# Patient Record
Sex: Female | Born: 1960 | Race: White | Hispanic: No | Smoking: Former smoker
Health system: Southern US, Community
[De-identification: ages and names within clinical notes are randomized; demographics above are authoritative.]

## PROBLEM LIST (undated history)

## (undated) DIAGNOSIS — I10 Essential (primary) hypertension: Secondary | ICD-10-CM

## (undated) DIAGNOSIS — F419 Anxiety disorder, unspecified: Secondary | ICD-10-CM

## (undated) DIAGNOSIS — M1990 Unspecified osteoarthritis, unspecified site: Secondary | ICD-10-CM

## (undated) DIAGNOSIS — T7840XA Allergy, unspecified, initial encounter: Secondary | ICD-10-CM

## (undated) DIAGNOSIS — G43909 Migraine, unspecified, not intractable, without status migrainosus: Secondary | ICD-10-CM

## (undated) HISTORY — PX: NO PAST SURGERIES: SHX2092

## (undated) HISTORY — DX: Allergy, unspecified, initial encounter: T78.40XA

## (undated) HISTORY — DX: Unspecified osteoarthritis, unspecified site: M19.90

## (undated) HISTORY — DX: Anxiety disorder, unspecified: F41.9

## (undated) HISTORY — DX: Migraine, unspecified, not intractable, without status migrainosus: G43.909

## (undated) HISTORY — DX: Essential (primary) hypertension: I10

---

## 1998-07-18 ENCOUNTER — Other Ambulatory Visit: Admission: RE | Admit: 1998-07-18 | Discharge: 1998-07-18 | Payer: Self-pay | Admitting: Obstetrics and Gynecology

## 1998-09-16 ENCOUNTER — Ambulatory Visit (HOSPITAL_COMMUNITY): Admission: RE | Admit: 1998-09-16 | Discharge: 1998-09-16 | Payer: Self-pay | Admitting: Obstetrics and Gynecology

## 2002-03-15 ENCOUNTER — Other Ambulatory Visit: Admission: RE | Admit: 2002-03-15 | Discharge: 2002-03-15 | Payer: Self-pay | Admitting: Obstetrics and Gynecology

## 2002-03-24 ENCOUNTER — Ambulatory Visit (HOSPITAL_COMMUNITY): Admission: RE | Admit: 2002-03-24 | Discharge: 2002-03-24 | Payer: Self-pay | Admitting: Obstetrics and Gynecology

## 2002-03-24 ENCOUNTER — Encounter: Payer: Self-pay | Admitting: Obstetrics and Gynecology

## 2006-05-18 ENCOUNTER — Other Ambulatory Visit: Admission: RE | Admit: 2006-05-18 | Discharge: 2006-05-18 | Payer: Self-pay | Admitting: Obstetrics and Gynecology

## 2006-05-24 ENCOUNTER — Encounter: Admission: RE | Admit: 2006-05-24 | Discharge: 2006-05-24 | Payer: Self-pay | Admitting: Obstetrics and Gynecology

## 2010-04-01 ENCOUNTER — Encounter (INDEPENDENT_AMBULATORY_CARE_PROVIDER_SITE_OTHER): Payer: Self-pay | Admitting: *Deleted

## 2010-05-07 ENCOUNTER — Encounter (INDEPENDENT_AMBULATORY_CARE_PROVIDER_SITE_OTHER): Payer: Self-pay | Admitting: *Deleted

## 2010-06-10 ENCOUNTER — Ambulatory Visit (HOSPITAL_COMMUNITY): Admission: RE | Admit: 2010-06-10 | Discharge: 2010-06-10 | Payer: Self-pay | Admitting: Obstetrics

## 2010-09-14 ENCOUNTER — Encounter: Payer: Self-pay | Admitting: Obstetrics and Gynecology

## 2010-09-23 NOTE — Letter (Signed)
Summary: Pre Visit No Show Letter  [facility]  [address] [place], [place] [address]   Phone: [phone]  Fax: [phone]        May 07, 2010 MRN: [id number]    Brooke Harrison [address] [place], [place]  [address]    Dear [name],   We have been unable to reach you by phone concerning the pre-procedure visit that you missed on 05/07/10. For this reason,your procedure scheduled on 06/02/10 has been cancelled. Our scheduling staff will gladly assist you with rescheduling your appointments at a more convenient time. Please call our office at [phone] between the hours of 8:00am and 5:00pm, press option #2 to reach an appointment scheduler. Please consider updating your contact numbers at this time so that we can reach you by phone in the future with schedule changes or results.    Thank you,    Wyona Almas RN [facility]

## 2010-09-23 NOTE — Letter (Signed)
Summary: Previsit letter  [facility]  [address] [place], [place] [address]   Phone: [phone]  Fax: [phone]       04/01/2010 MRN: [id number]  [facility] [address] [place], [place]  [address]  Dear Brooke Harrison,  Welcome to the Gastroenterology Division at [facility].    You are scheduled to see a nurse for your pre-procedure visit on 05-07-10 at 4:00p.m. on the 3rd floor at [facility], [address]  We ask that you try to arrive at our office 15 minutes prior to your appointment time to allow for check-in.  Your nurse visit will consist of discussing your medical and surgical history, your immediate family medical history, and your medications.    Please bring a complete list of all your medications or, if you prefer, bring the medication bottles and we will list them.  We will need to be aware of both prescribed and over the counter drugs.  We will need to know exact dosage information as well.  If you are on blood thinners (Coumadin, Plavix, Aggrenox, Ticlid, etc.) please call our office today/prior to your appointment, as we need to consult with your physician about holding your medication.   Please be prepared to read and sign documents such as consent forms, a financial agreement, and acknowledgement forms.  If necessary, and with your consent, a friend or relative is welcome to sit-in on the nurse visit with you.  Please bring your insurance card so that we may make a copy of it.  If your insurance requires a referral to see a specialist, please bring your referral form from your primary care physician.  No co-pay is required for this nurse visit.     If you cannot keep your appointment, please call [phone] to cancel or reschedule prior to your appointment date.  This allows [place] the opportunity to schedule an appointment for another patient in need of care.    Thank you for choosing [facility] for your medical  needs.  We appreciate the opportunity to care for you.  Please visit [place] at our website  to learn more about our practice.                     Sincerely.                                                                                                                   The Gastroenterology Division

## 2013-04-28 ENCOUNTER — Ambulatory Visit: Payer: BC Managed Care – PPO

## 2013-04-28 ENCOUNTER — Ambulatory Visit (INDEPENDENT_AMBULATORY_CARE_PROVIDER_SITE_OTHER): Payer: BC Managed Care – PPO | Admitting: Emergency Medicine

## 2013-04-28 VITALS — BP 110/74 | HR 79 | Temp 98.5°F | Resp 18 | Ht 69.0 in | Wt 192.0 lb

## 2013-04-28 DIAGNOSIS — R0600 Dyspnea, unspecified: Secondary | ICD-10-CM

## 2013-04-28 DIAGNOSIS — R05 Cough: Secondary | ICD-10-CM

## 2013-04-28 DIAGNOSIS — R0609 Other forms of dyspnea: Secondary | ICD-10-CM

## 2013-04-28 DIAGNOSIS — I10 Essential (primary) hypertension: Secondary | ICD-10-CM | POA: Insufficient documentation

## 2013-04-28 DIAGNOSIS — R5381 Other malaise: Secondary | ICD-10-CM

## 2013-04-28 LAB — POCT CBC
Granulocyte percent: 69.4 %G (ref 37–80)
HCT, POC: 41.4 % (ref 37.7–47.9)
Hemoglobin: 13.1 g/dL (ref 12.2–16.2)
Lymph, poc: 1.5 (ref 0.6–3.4)
MCH, POC: 28.6 pg (ref 27–31.2)
MCV: 90.3 fL (ref 80–97)
MPV: 10.1 fL (ref 0–99.8)
POC LYMPH PERCENT: 25.2 %L (ref 10–50)
POC MID %: 5.4 %M (ref 0–12)
Platelet Count, POC: 216 10*3/uL (ref 142–424)
RBC: 4.58 M/uL (ref 4.04–5.48)
WBC: 5.8 10*3/uL (ref 4.6–10.2)

## 2013-04-28 MED ORDER — BENZONATATE 100 MG PO CAPS: 100.0000 mg | ORAL_CAPSULE | Freq: Three times a day (TID) | ORAL | Status: DC | PRN

## 2013-04-28 NOTE — Progress Notes (Signed)
  Subjective:    Patient ID: Brooke Harrison, female    DOB: 1961-08-22, 52 y.o.   MRN: [id number]  HPI Pt reports no energy, dyspnea. Started last Saturday with a cold, still has some nasal drip, cough. But fatigue has remained constant. No one sick at home. Non smoker. She also reports Sore throat, dizziness. She took some robitussin, benadryl last night.    Review of Systems     Objective:   Physical Exam patient is alert and cooperative she is not in any distress. Her neck is supple. Chest exam reveals a few faint rhonchi in the right base there is no dullness. Cardiac is regular rate no murmurs. Abdomen soft nontender.  [facility] reading (PRIMARY) by  Dr. Cleta Alberts appears to be some mild increased interstitial markings in the bases but otherwise unremarkable. There are no consolidated areas and heart size is normal.  Results for orders placed in visit on 04/28/13  POCT CBC      Result Value Range   WBC 5.8  4.6 - 10.2 K/uL   Lymph, poc 1.5  0.6 - 3.4   POC LYMPH PERCENT 25.2  10 - 50 %L   MID (cbc) 0.3  0 - 0.9   POC MID % 5.4  0 - 12 %M   POC Granulocyte 4.0  2 - 6.9   Granulocyte percent 69.4  37 - 80 %G   RBC 4.58  4.04 - 5.48 M/uL   Hemoglobin 13.1  12.2 - 16.2 g/dL   HCT, POC 11.9  14.7 - 47.9 %   MCV 90.3  80 - 97 fL   MCH, POC 28.6  27 - 31.2 pg   MCHC 31.6 (*) 31.8 - 35.4 g/dL   RDW, POC 82.9     Platelet Count, POC 216  142 - 424 K/uL   MPV 10.1  0 - 99.8 fL        Assessment & Plan:  We'll treat with Tessalon Perles she was given a note for work [employer]

## 2013-04-28 NOTE — Progress Notes (Signed)
  Subjective:    Patient ID: Brooke Harrison, female    DOB: 03/05/1961, 52 y.o.   MRN: [id number]  HPI    Review of Systems     Objective:   Physical Exam        Assessment & Plan:

## 2013-04-29 LAB — COMPREHENSIVE METABOLIC PANEL
Albumin: 3.4 g/dL — ABNORMAL LOW (ref 3.5–5.2)
Alkaline Phosphatase: 55 U/L (ref 39–117)
BUN: 12 mg/dL (ref 6–23)
Calcium: 8.6 mg/dL (ref 8.4–10.5)
Chloride: 107 mEq/L (ref 96–112)
Glucose, Bld: 105 mg/dL — ABNORMAL HIGH (ref 70–99)
Potassium: 4.2 mEq/L (ref 3.5–5.3)
Sodium: 138 mEq/L (ref 135–145)
Total Protein: 5.2 g/dL — ABNORMAL LOW (ref 6.0–8.3)

## 2013-05-01 ENCOUNTER — Other Ambulatory Visit (HOSPITAL_COMMUNITY): Payer: Self-pay | Admitting: Internal Medicine

## 2013-05-01 ENCOUNTER — Telehealth: Payer: Self-pay

## 2013-05-01 DIAGNOSIS — Z1231 Encounter for screening mammogram for malignant neoplasm of breast: Secondary | ICD-10-CM

## 2013-05-01 NOTE — Telephone Encounter (Signed)
PT HAD LABS DONE AND WAS GIVEN A CALL THE OTHER DAY. PLEASE CALL [phone]

## 2013-05-01 NOTE — Telephone Encounter (Signed)
Daub, MD on 04/30/2013 at 10:59 AM Her sugar is slightly elevated and her albumin is slightly low. It would be a good idea at some time in the near future to schedule for a complete physical just to make sure her examination is normal. Called her to advise. She indicates she did have a physical couple months ago with Dr Nicholos Johns. Results sent to his office for her. She also notes she had peanut butter crackers and juice just prior to the lab draw.

## 2013-05-08 ENCOUNTER — Ambulatory Visit (HOSPITAL_COMMUNITY): Payer: Self-pay | Attending: Internal Medicine

## 2013-08-23 ENCOUNTER — Other Ambulatory Visit: Payer: Self-pay | Admitting: Emergency Medicine

## 2013-09-04 ENCOUNTER — Encounter: Payer: Self-pay | Admitting: Gastroenterology

## 2013-10-27 ENCOUNTER — Ambulatory Visit (AMBULATORY_SURGERY_CENTER): Payer: BC Managed Care – PPO

## 2013-10-27 VITALS — Ht 69.0 in | Wt 190.0 lb

## 2013-10-27 DIAGNOSIS — Z1211 Encounter for screening for malignant neoplasm of colon: Secondary | ICD-10-CM

## 2013-10-27 MED ORDER — MOVIPREP 100 G PO SOLR: 1.0000 | Freq: Once | ORAL | Status: DC

## 2013-11-10 ENCOUNTER — Encounter: Payer: Self-pay | Admitting: Gastroenterology

## 2014-03-16 ENCOUNTER — Encounter (HOSPITAL_COMMUNITY): Payer: Self-pay | Admitting: Emergency Medicine

## 2014-03-16 ENCOUNTER — Emergency Department (HOSPITAL_COMMUNITY)
Admission: EM | Admit: 2014-03-16 | Discharge: 2014-03-16 | Disposition: A | Discharge status: Home / Self Care | Payer: Worker's Compensation | Attending: Emergency Medicine | Admitting: Emergency Medicine

## 2014-03-16 DIAGNOSIS — F411 Generalized anxiety disorder: Secondary | ICD-10-CM | POA: Insufficient documentation

## 2014-03-16 DIAGNOSIS — Z79899 Other long term (current) drug therapy: Secondary | ICD-10-CM | POA: Diagnosis not present

## 2014-03-16 DIAGNOSIS — R112 Nausea with vomiting, unspecified: Secondary | ICD-10-CM | POA: Diagnosis not present

## 2014-03-16 DIAGNOSIS — R197 Diarrhea, unspecified: Secondary | ICD-10-CM | POA: Insufficient documentation

## 2014-03-16 DIAGNOSIS — I10 Essential (primary) hypertension: Secondary | ICD-10-CM | POA: Insufficient documentation

## 2014-03-16 DIAGNOSIS — Z8739 Personal history of other diseases of the musculoskeletal system and connective tissue: Secondary | ICD-10-CM | POA: Diagnosis not present

## 2014-03-16 DIAGNOSIS — R109 Unspecified abdominal pain: Secondary | ICD-10-CM | POA: Insufficient documentation

## 2014-03-16 DIAGNOSIS — Z87891 Personal history of nicotine dependence: Secondary | ICD-10-CM | POA: Diagnosis not present

## 2014-03-16 LAB — CBC
HCT: 36.8 % (ref 36.0–46.0)
Hemoglobin: 12.4 g/dL (ref 12.0–15.0)
MCH: 28.4 pg (ref 26.0–34.0)
MCHC: 33.7 g/dL (ref 30.0–36.0)
MCV: 84.2 fL (ref 78.0–100.0)
PLATELETS: 201 10*3/uL (ref 150–400)
RBC: 4.37 MIL/uL (ref 3.87–5.11)
RDW: 12.4 % (ref 11.5–15.5)
WBC: 6.5 10*3/uL (ref 4.0–10.5)

## 2014-03-16 LAB — I-STAT CHEM 8, ED
BUN: 20 mg/dL (ref 6–23)
CREATININE: 0.9 mg/dL (ref 0.50–1.10)
Calcium, Ion: 1.2 mmol/L (ref 1.12–1.23)
Chloride: 105 mEq/L (ref 96–112)
GLUCOSE: 102 mg/dL — AB (ref 70–99)
HCT: 38 % (ref 36.0–46.0)
Hemoglobin: 12.9 g/dL (ref 12.0–15.0)
Potassium: 3.6 mEq/L — ABNORMAL LOW (ref 3.7–5.3)
Sodium: 140 mEq/L (ref 137–147)
TCO2: 22 mmol/L (ref 0–100)

## 2014-03-16 MED ORDER — SODIUM CHLORIDE 0.9 % IV BOLUS (SEPSIS)
1000.0000 mL | Freq: Once | INTRAVENOUS | Status: AC
  Administered 2014-03-16: 1000 mL via INTRAVENOUS

## 2014-03-16 MED ORDER — KETOROLAC TROMETHAMINE 30 MG/ML IJ SOLN
30.0000 mg | Freq: Once | INTRAMUSCULAR | Status: AC
  Administered 2014-03-16: 30 mg via INTRAVENOUS
  Filled 2014-03-16: qty 1

## 2014-03-16 MED ORDER — GI COCKTAIL ~~LOC~~
30.0000 mL | Freq: Once | ORAL | Status: AC
  Administered 2014-03-16: 30 mL via ORAL
  Filled 2014-03-16: qty 30

## 2014-03-16 MED ORDER — POTASSIUM CHLORIDE CRYS ER 20 MEQ PO TBCR
20.0000 meq | EXTENDED_RELEASE_TABLET | Freq: Once | ORAL | Status: AC
  Administered 2014-03-16: 20 meq via ORAL
  Filled 2014-03-16: qty 1

## 2014-03-16 MED ORDER — ONDANSETRON HCL 4 MG/2ML IJ SOLN
4.0000 mg | Freq: Once | INTRAMUSCULAR | Status: AC
  Administered 2014-03-16: 4 mg via INTRAVENOUS
  Filled 2014-03-16: qty 2

## 2014-03-16 NOTE — ED Notes (Signed)
Per pt report: pt c/o burning sensation in her esophagus area, nauseas in her abd, and cramps in her lower abd.  Pt reports 1 episode of vomiting.  Pt also endorses diarrhea.

## 2014-03-16 NOTE — ED Provider Notes (Signed)
Medical screening examination/treatment/procedure(s) were performed by non-physician practitioner and as supervising physician I was immediately available for consultation/collaboration.   EKG Interpretation None        Julianne Rice, MD 03/16/14 [phone]

## 2014-03-16 NOTE — Discharge Instructions (Signed)
Today your lab values were within normal limits, you were given IV fluid, and medication for symptom control of the nausea, vomiting, and diarrhea.  Please try to eat lightly for the next hours, you should not need any further treatment

## 2014-03-16 NOTE — ED Provider Notes (Signed)
CSN: [id number]     Arrival date & time 03/16/14  0116 History   First MD Initiated Contact with Patient 03/16/14 [phone]     Chief Complaint  Patient presents with  . Abdominal Pain     (Consider location/radiation/quality/duration/timing/severity/associated sxs/prior Treatment) HPI Comments: Patient states she works for [employer], approximately one week ago.  A carton of amoxicillin still open.  It was cauterized gets several of these burst open and patient states she was "cough or with the powder."  She thinks she may have breathed some of this in 2-3, days later, she noted she was having some abdominal cramping, and one episode of vomiting, and one loose stool.  She denies any shortness of breath, coughing.  Patient is a 53 y.o. female presenting with abdominal pain. The history is provided by the patient.  Abdominal Pain Pain location:  Generalized Pain quality: aching and burning   Pain severity:  Mild Onset quality:  Gradual Duration:  1 week Timing:  Intermittent Progression:  Worsening Chronicity:  New Relieved by:  Nothing Worsened by:  Nothing tried Ineffective treatments:  None tried Associated symptoms: diarrhea, nausea and vomiting   Associated symptoms: no anorexia, no chills, no cough, no dysuria, no fever and no shortness of breath   Diarrhea:    Quality:  Semi-solid   Number of occurrences:  1   Severity:  Mild Nausea:    Severity:  Mild   Onset quality:  Gradual   Duration:  3 days   Timing:  Intermittent Vomiting:    Quality:  Bilious material   Number of occurrences:  1   Severity:  Mild Risk factors comment:  Exposure to powder, amoxicillin   Past Medical History  Diagnosis Date  . Allergy   . Arthritis   . Anxiety   . Hypertension    History reviewed. No pertinent past surgical history. Family History  Problem Relation Age of Onset  . Colon cancer Neg Hx    History  Substance Use Topics  . Smoking status: Former Research scientist (life sciences)  . Smokeless tobacco:  Never Used  . Alcohol Use: Yes     Comment: 1 every 5 months   OB History   Grav Para Term Preterm Abortions TAB SAB Ect Mult Living                 Review of Systems  Constitutional: Negative for fever and chills.  Respiratory: Negative for cough and shortness of breath.   Gastrointestinal: Positive for nausea, vomiting, abdominal pain and diarrhea. Negative for anorexia.  Genitourinary: Negative for dysuria.  Skin: Negative for wound.  Psychiatric/Behavioral: The patient is nervous/anxious.   All other systems reviewed and are negative.     Allergies  Review of patient's allergies indicates no known allergies.  Home Medications   Prior to Admission medications   Medication Sig Start Date End Date Taking? Authorizing Provider  amLODipine (NORVASC) 5 MG tablet Take 5 mg by mouth daily.   Yes Historical Provider, MD  FLUoxetine (PROZAC) 20 MG tablet Take 20 mg by mouth daily.   Yes Historical Provider, MD   BP 137/86  Pulse 77  Temp(Src) 97.9 F (36.6 C) (Oral)  Resp 18  SpO2 100% Physical Exam  Nursing note and vitals reviewed. Constitutional: She is oriented to person, place, and time. She appears well-developed and well-nourished. No distress.  HENT:  Mouth/Throat: Oropharynx is clear and moist.  Eyes: Pupils are equal, round, and reactive to light.  Neck: Normal range of motion.  Cardiovascular: Normal rate and regular rhythm.   Pulmonary/Chest: Effort normal and breath sounds normal.  Abdominal: Soft. Bowel sounds are normal. She exhibits no distension. There is no tenderness.  Musculoskeletal: Normal range of motion.  Neurological: She is alert and oriented to person, place, and time.  Skin: Skin is warm and dry.    ED Course  Procedures (including critical care time) Labs Review Labs Reviewed  I-STAT CHEM 8, ED - Abnormal; Notable for the following:    Potassium 3.6 (*)    Glucose, Bld 102 (*)    All other components within normal limits  CBC     Imaging Review No results found.   EKG Interpretation None      MDM  Patient's CBC i-STAT within normal limits should be given IV fluid, Zofran.  Her dressing was noted to be 3.6.  This was supplemented with 20 mEq of potassium by mouth on reassessment patient states, that she is feeling significantly better, although her abdominal cramping, is slightly returning.  She will be given, 30 mg of IV to oral and discharge home Final diagnoses:  Nausea vomiting and diarrhea         Garald Balding, NP 03/16/14 [phone]

## 2016-06-26 DIAGNOSIS — I10 Essential (primary) hypertension: Secondary | ICD-10-CM | POA: Diagnosis not present

## 2016-06-26 DIAGNOSIS — R51 Headache: Secondary | ICD-10-CM | POA: Diagnosis not present

## 2016-10-08 ENCOUNTER — Emergency Department (HOSPITAL_COMMUNITY)
Admission: EM | Admit: 2016-10-08 | Discharge: 2016-10-08 | Disposition: A | Discharge status: Home / Self Care | Payer: BLUE CROSS/BLUE SHIELD | Attending: Emergency Medicine | Admitting: Emergency Medicine

## 2016-10-08 ENCOUNTER — Emergency Department (HOSPITAL_COMMUNITY): Payer: BLUE CROSS/BLUE SHIELD

## 2016-10-08 ENCOUNTER — Encounter (HOSPITAL_COMMUNITY): Payer: Self-pay | Admitting: Emergency Medicine

## 2016-10-08 DIAGNOSIS — R55 Syncope and collapse: Secondary | ICD-10-CM | POA: Diagnosis not present

## 2016-10-08 DIAGNOSIS — I10 Essential (primary) hypertension: Secondary | ICD-10-CM | POA: Insufficient documentation

## 2016-10-08 DIAGNOSIS — R05 Cough: Secondary | ICD-10-CM | POA: Diagnosis not present

## 2016-10-08 DIAGNOSIS — Z79899 Other long term (current) drug therapy: Secondary | ICD-10-CM | POA: Diagnosis not present

## 2016-10-08 DIAGNOSIS — R42 Dizziness and giddiness: Secondary | ICD-10-CM

## 2016-10-08 DIAGNOSIS — Z87891 Personal history of nicotine dependence: Secondary | ICD-10-CM | POA: Diagnosis not present

## 2016-10-08 DIAGNOSIS — Z7982 Long term (current) use of aspirin: Secondary | ICD-10-CM | POA: Insufficient documentation

## 2016-10-08 LAB — BASIC METABOLIC PANEL
ANION GAP: 8 (ref 5–15)
BUN: 17 mg/dL (ref 6–20)
CO2: 24 mmol/L (ref 22–32)
Calcium: 9.3 mg/dL (ref 8.9–10.3)
Chloride: 108 mmol/L (ref 101–111)
Creatinine, Ser: 0.76 mg/dL (ref 0.44–1.00)
GFR calc Af Amer: >60 mL/min (ref >60)
Glucose, Bld: 112 mg/dL — ABNORMAL HIGH (ref 65–99)
Potassium: 3.9 mmol/L (ref 3.5–5.1)
SODIUM: 140 mmol/L (ref 135–145)

## 2016-10-08 LAB — CBC WITH DIFFERENTIAL/PLATELET
BASOS ABS: 0 10*3/uL (ref 0.0–0.1)
Basophils Relative: 0 %
EOS PCT: 1 %
Eosinophils Absolute: 0.1 10*3/uL (ref 0.0–0.7)
HEMATOCRIT: 38.7 % (ref 36.0–46.0)
Hemoglobin: 13.3 g/dL (ref 12.0–15.0)
Lymphocytes Relative: 12 %
Lymphs Abs: 1.1 10*3/uL (ref 0.7–4.0)
MCH: 28.5 pg (ref 26.0–34.0)
MCHC: 34.4 g/dL (ref 30.0–36.0)
MCV: 83 fL (ref 78.0–100.0)
Monocytes Absolute: 0.5 10*3/uL (ref 0.1–1.0)
Monocytes Relative: 5 %
NEUTROS ABS: 8.2 10*3/uL — AB (ref 1.7–7.7)
NEUTROS PCT: 82 %
PLATELETS: 243 10*3/uL (ref 150–400)
RBC: 4.66 MIL/uL (ref 3.87–5.11)
RDW: 12.5 % (ref 11.5–15.5)
WBC: 9.9 10*3/uL (ref 4.0–10.5)

## 2016-10-08 LAB — URINALYSIS, ROUTINE W REFLEX MICROSCOPIC
Bilirubin Urine: NEGATIVE
Glucose, UA: NEGATIVE mg/dL
KETONES UR: NEGATIVE mg/dL
NITRITE: NEGATIVE
Protein, ur: NEGATIVE mg/dL
Specific Gravity, Urine: 1.01 (ref 1.005–1.030)
pH: 7 (ref 5.0–8.0)

## 2016-10-08 LAB — I-STAT TROPONIN, ED
Troponin i, poc: 0.01 ng/mL (ref 0.00–0.08)
Troponin i, poc: 0 ng/mL (ref 0.00–0.08)

## 2016-10-08 MED ORDER — MECLIZINE HCL 25 MG PO TABS
25.0000 mg | ORAL_TABLET | Freq: Once | ORAL | Status: AC
  Administered 2016-10-08: 25 mg via ORAL
  Filled 2016-10-08: qty 1

## 2016-10-08 MED ORDER — GADOBENATE DIMEGLUMINE 529 MG/ML IV SOLN
20.0000 mL | Freq: Once | INTRAVENOUS | Status: AC | PRN
  Administered 2016-10-08: 17 mL via INTRAVENOUS

## 2016-10-08 MED ORDER — MECLIZINE HCL 25 MG PO TABS
25.0000 mg | ORAL_TABLET | Freq: Three times a day (TID) | ORAL | 0 refills | Status: DC | PRN
Start: 2016-10-08 — End: 2016-12-16

## 2016-10-08 MED ORDER — LORAZEPAM 2 MG/ML IJ SOLN
1.0000 mg | Freq: Once | INTRAMUSCULAR | Status: AC | PRN
Start: 2016-10-08 — End: 2016-10-08
  Administered 2016-10-08: 1 mg via INTRAVENOUS
  Filled 2016-10-08: qty 1

## 2016-10-08 MED ORDER — ONDANSETRON HCL 4 MG/2ML IJ SOLN
4.0000 mg | Freq: Once | INTRAMUSCULAR | Status: AC
  Administered 2016-10-08: 4 mg via INTRAVENOUS
  Filled 2016-10-08: qty 2

## 2016-10-08 MED ORDER — ONDANSETRON HCL 4 MG PO TABS
4.0000 mg | ORAL_TABLET | Freq: Four times a day (QID) | ORAL | 0 refills | Status: DC
Start: 2016-10-08 — End: 2016-12-16

## 2016-10-08 NOTE — ED Notes (Signed)
Patient being transported to MRI

## 2016-10-08 NOTE — ED Provider Notes (Signed)
[facility] DEPT Provider Note   CSN: ID:[id number] Arrival date & time: 10/08/16  [id number]     History   Chief Complaint Chief Complaint  Patient presents with  . Loss of Consciousness    HPI Brooke Harrison is a 56 y.o. female.  The history is provided by the patient. No language interpreter was used.  Loss of Consciousness      Brooke Harrison is a 56 y.o. female who presents to the Emergency Department complaining of syncope.  She presents following an episode of syncope today. Over the last 2 weeks she has had a swishing sensation in bilateral ears, left greater than right. Today she was feeling well when she was walking in her bedroom when she had a syncopal event. She briefly passed out and had no preceding symptoms. When she awoke she was on the floor with mild tingling in bilateral hands as well as a dizzy sensation where she felt vertiginous and had one episode of emesis with diaphoresis. She denies any chest pain, shortness of breath, weakness. Her dizziness was persistent following the syncopal event but now is intermittent and worse with movement. Reactive hypertension but no other medical problems. No prior similar symptoms.  Past Medical History:  Diagnosis Date  . Allergy   . Anxiety   . Arthritis   . Hypertension     Patient Active Problem List   Diagnosis Date Noted  . Unspecified essential hypertension 04/28/2013    History reviewed. No pertinent surgical history.  OB History    No data available       Home Medications    Prior to Admission medications   Medication Sig Start Date End Date Taking? Authorizing Provider  amLODipine (NORVASC) 5 MG tablet Take 5 mg by mouth daily.   Yes Historical Provider, MD  Aspirin-Acetaminophen-Caffeine (GOODY HEADACHE PO) Take 1 packet by mouth every 6 (six) hours as needed (for headaches).   Yes Historical Provider, MD  diphenhydrAMINE (BENADRYL) 25 MG tablet Take 25 mg by mouth every 6 (six) hours as needed (for  headaches).   Yes Historical Provider, MD  FLUoxetine (PROZAC) 40 MG capsule Take 40 mg by mouth daily.   Yes Historical Provider, MD  meclizine (ANTIVERT) 25 MG tablet Take 1 tablet (25 mg total) by mouth 3 (three) times daily as needed for dizziness. 10/08/16   Quintella Reichert, MD  ondansetron (ZOFRAN) 4 MG tablet Take 1 tablet (4 mg total) by mouth every 6 (six) hours. 10/08/16   Quintella Reichert, MD    Family History Family History  Problem Relation Age of Onset  . Colon cancer Neg Hx     Social History Social History  Substance Use Topics  . Smoking status: Former Research scientist (life sciences)  . Smokeless tobacco: Never Used  . Alcohol use Yes     Comment: 1 every 5 months     Allergies   Patient has no known allergies.   Review of Systems Review of Systems  Cardiovascular: Positive for syncope.  All other systems reviewed and are negative.    Physical Exam Updated Vital Signs BP 139/84   Pulse 70   Temp 97.9 F (36.6 C) (Oral)   Resp 18   Wt 195 lb (88.5 kg)   SpO2 99%   BMI 28.80 kg/m   Physical Exam  Constitutional: She is oriented to person, place, and time. She appears well-developed and well-nourished.  HENT:  Head: Normocephalic and atraumatic.  Right Ear: External ear normal.  Left Ear: External ear  normal.  Eyes: EOM are normal. Pupils are equal, round, and reactive to light.  Neck:  No carotid bruit  Cardiovascular: Normal rate and regular rhythm.   No murmur heard. Pulmonary/Chest: Effort normal and breath sounds normal. No respiratory distress.  Abdominal: Soft. There is no tenderness. There is no rebound and no guarding.  Musculoskeletal: She exhibits no edema or tenderness.  Neurological: She is alert and oriented to person, place, and time. No cranial nerve deficit.  5 out of 5 strength in all 4 extremities. Sensation to light touch intact in all 4 extremities. No pronator drift. No ataxia or FTN bilaterally.  Skin: Skin is warm and dry.  Psychiatric: She has a  normal mood and affect. Her behavior is normal.  Nursing note and vitals reviewed.    ED Treatments / Results  Labs (all labs ordered are listed, but only abnormal results are displayed) Labs Reviewed  BASIC METABOLIC PANEL - Abnormal; Notable for the following:       Result Value   Glucose, Bld 112 (*)    All other components within normal limits  CBC WITH DIFFERENTIAL/PLATELET - Abnormal; Notable for the following:    Neutro Abs 8.2 (*)    All other components within normal limits  URINALYSIS, ROUTINE W REFLEX MICROSCOPIC - Abnormal; Notable for the following:    APPearance CLOUDY (*)    Hgb urine dipstick SMALL (*)    Leukocytes, UA SMALL (*)    Bacteria, UA RARE (*)    Squamous Epithelial / LPF 0-5 (*)    All other components within normal limits  I-STAT TROPOININ, ED  I-STAT TROPOININ, ED    EKG  EKG Interpretation  Date/Time:  Thursday October 08 2016 07:35:25 EST Ventricular Rate:  59 PR Interval:    QRS Duration: 122 QT Interval:  468 QTC Calculation: 464 R Axis:   67 Text Interpretation:  Sinus rhythm Nonspecific intraventricular conduction delay Nonspecific T abnrm, anterolateral leads Confirmed by Hazle Coca [phone]) on 10/08/2016 8:00:46 AM       Radiology Dg Chest 2 View  Result Date: 10/08/2016 CLINICAL DATA:  Syncope this morning, cough, dizziness EXAM: CHEST  2 VIEW COMPARISON:  04/28/2013 FINDINGS: Cardiomediastinal silhouette is unremarkable. No infiltrate or pleural effusion. No pulmonary edema. Mild degenerative changes mid thoracic spine. IMPRESSION: No active cardiopulmonary disease. Electronically Signed   By: Lahoma Crocker M.D.   On: 10/08/2016 08:58   Ct Head Wo Contrast  Result Date: 10/08/2016 CLINICAL DATA:  Patient with dizziness. Syncopal episode. Vomiting. EXAM: CT HEAD WITHOUT CONTRAST TECHNIQUE: Contiguous axial images were obtained from the base of the skull through the vertex without intravenous contrast. COMPARISON:  None. FINDINGS:  Brain: No acute cortically based infarct, intracranial hemorrhage or significant mass effect. Posteriorly at the level of the tentorium there is a 2.7 cm predominately fluid attenuation mass with internal soft tissue density (image 15; series 2). There is underlying scalloping of the inner table of the skull. Vascular: No hyperdense vessel or unexpected calcification. Skull: Scalloping of the inner table of the skull posteriorly. Sinuses/Orbits: No acute finding. Other: None. IMPRESSION: There is a predominately fluid attenuation mass lesion posteriorly at the level of the tentorium with scalloping of the adjacent skull. There is suggestion of internal soft tissue density. This is nonspecific in etiology however potentially represents a large arachnoid granulation. In the nonacute setting, recommend correlation with contrast-enhanced brain MRI. No acute intracranial process. Electronically Signed   By: Lovey Newcomer M.D.   On: 10/08/2016  09:26   Mr Jeri Cos And Wo Contrast  Result Date: 10/08/2016 CLINICAL DATA:  56 year old female who awoke with dizziness this morning and then had a syncopal episode. Vomiting. Initial encounter. EXAM: MRI HEAD WITHOUT AND WITH CONTRAST TECHNIQUE: Multiplanar, multiecho pulse sequences of the brain and surrounding structures were obtained without and with intravenous contrast. CONTRAST:  67mL MULTIHANCE GADOBENATE DIMEGLUMINE 529 MG/ML IV SOLN COMPARISON:  Head CT without contrast 0855 hours today. FINDINGS: Brain: No restricted diffusion to suggest acute infarction. No midline shift, evidence of mass lesion, ventriculomegaly, extra-axial collection or acute intracranial hemorrhage. Cervicomedullary junction within normal limits. However, along the occipital protuberance just to the left of midline where the noncontrast CT earlier today showed an incomplete bony defect in the occipital bone there is focal herniation of the posterior left cerebellar hemisphere which appears  elongated (series 6, image 8 and series 4, image 13. There is no enhancement of the bony defect or surrounding soft tissues with otherwise normal CSF signal in the region, most resembling an arachnoid granulation measuring nearly 2 cm. It is unclear whether there may be some abnormal signal in the herniated cerebellar folia (series 7, image 7), with no overt cerebellar encephalomalacia. There is superimposed partially empty sella. Elsewhere normal cerebral volume and normal gray and white matter signal throughout the brain. No chronic cerebral blood products. No abnormal enhancement identified. No dural thickening. Vascular: Major intracranial vascular flow voids are preserved. The dural venous sinuses are normally enhancing. Skull and upper cervical spine: Negative. There is mild sclerosis of the calvarium diffusely, but otherwise visible bone marrow signal is normal. Sinuses/Orbits: Mildly disc conjugate gaze but otherwise negative orbits soft tissues. Paranasal sinuses are clear. Other: Mild bilateral mastoid effusions. Negative nasopharynx. Other visualized internal auditory structures appear normal. Negative scalp soft tissues. IMPRESSION: 1. 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 Such entities are described to occur spontaneously, or can be seen as a result of increased intracranial pressure (such as idiopathic intracranial hypertension - AKA pseudotumor cerebri). Although generally thought to be incidental findings, such lesions have sometimes been implicated in symptoms (such as seizure when there is cerebral temporal lobe involvement). In this case it is unclear whether there might be mild gliosis in the affected cerebellar tissue (series 7, image 7), which present would seem to confirm that this is a chronic process. 2. Superimposed partially empty sella, which is often a normal anatomic variation but can also be seen in  pseudotumor cerebri. 3. Otherwise normal MRI appearance of the brain. 4. Mild bilateral mastoid air cell effusions which are nonspecific, but most often are postinflammatory in nature. Electronically Signed   By: Genevie Ann M.D.   On: 10/08/2016 13:04    Procedures Procedures (including critical care time)  Medications Ordered in ED Medications  ondansetron (ZOFRAN) injection 4 mg (4 mg Intravenous Given 10/08/16 0941)  LORazepam (ATIVAN) injection 1 mg (1 mg Intravenous Given 10/08/16 1129)  gadobenate dimeglumine (MULTIHANCE) injection 20 mL (17 mLs Intravenous Contrast Given 10/08/16 1228)  ondansetron (ZOFRAN) injection 4 mg (4 mg Intravenous Given 10/08/16 1532)  meclizine (ANTIVERT) tablet 25 mg (25 mg Oral Given 10/08/16 1532)     Initial Impression / Assessment and Plan / ED Course  I have reviewed the triage vital signs and the nursing notes.  Pertinent labs & imaging results that were available during my care of the patient were reviewed by me and considered in my medical  decision making (see chart for details).     Pt here following syncopal episode with vertigo.  Clinical picture is not c/w SAH, ACS, PE, dissection.  She has a nonfocal neurologic exam.  She is able to ambulate but her vertigo recurs with ambulation.  CT head with cerebellar cyst - given sxs MRI obtained to further evaluate.  MRI with report as above - discussed case with Neurologist on call.  MRI findings likely an incidental finding and did not contribute to syncope or current vertiginous sxs.  Plan to d/c home with PCP follow up re vertigo and syncope.  Counseled patient on home care, outpatient follow up and return precautions.    Final Clinical Impressions(s) / ED Diagnoses   Final diagnoses:  Syncope, unspecified syncope type  Vertigo    New Prescriptions Discharge Medication List as of 10/08/2016  3:22 PM    START taking these medications   Details  meclizine (ANTIVERT) 25 MG tablet Take 1 tablet (25 mg  total) by mouth 3 (three) times daily as needed for dizziness., Starting Thu 10/08/2016, Print    ondansetron (ZOFRAN) 4 MG tablet Take 1 tablet (4 mg total) by mouth every 6 (six) hours., Starting Thu 10/08/2016, Print         Quintella Reichert, MD 10/08/16 [phone]

## 2016-10-08 NOTE — ED Notes (Signed)
Pt being taken to radiology before labs could be drawn.

## 2016-10-08 NOTE — ED Triage Notes (Signed)
Pt reports she woke up with dizziness this am and had a syncopal episode. Also threw up 1x this am. No CP or SOB.  Has been hearing heartbeat in L ear for the past week, otherwise no other prior symptoms.

## 2016-10-08 NOTE — ED Notes (Signed)
Pt given coffee as requested.  She has been updated on the plan for MRI and potential wait.

## 2016-10-08 NOTE — ED Notes (Signed)
Pt c/o "dizzy spells" and nausea and wants to wait a bit to do the orthostatic VS as she just received zofran.

## 2016-10-08 NOTE — ED Notes (Signed)
Patient has returned from MRI

## 2016-10-08 NOTE — ED Notes (Signed)
Pt walked to bathroom with standby assist to void.

## 2016-10-08 NOTE — ED Notes (Signed)
Attempted to obtain orthostatic vitals, patient is extremely dizzy

## 2016-10-08 NOTE — ED Notes (Signed)
MRI CALLED- SCHEDULED TO PICK UP PT APPROX 1130. WILL CALL BEFORE TO DOSE PT WITH ATIVAN.

## 2016-10-13 DIAGNOSIS — R42 Dizziness and giddiness: Secondary | ICD-10-CM | POA: Diagnosis not present

## 2016-10-13 DIAGNOSIS — G932 Benign intracranial hypertension: Secondary | ICD-10-CM | POA: Diagnosis not present

## 2016-10-13 DIAGNOSIS — R55 Syncope and collapse: Secondary | ICD-10-CM | POA: Diagnosis not present

## 2016-10-21 ENCOUNTER — Ambulatory Visit (INDEPENDENT_AMBULATORY_CARE_PROVIDER_SITE_OTHER): Payer: BLUE CROSS/BLUE SHIELD | Admitting: Neurology

## 2016-10-21 ENCOUNTER — Encounter: Payer: Self-pay | Admitting: Neurology

## 2016-10-21 VITALS — BP 145/87 | HR 64 | Ht 69.0 in | Wt 199.8 lb

## 2016-10-21 DIAGNOSIS — D720 Genetic anomalies of leukocytes: Secondary | ICD-10-CM

## 2016-10-21 DIAGNOSIS — G932 Benign intracranial hypertension: Secondary | ICD-10-CM

## 2016-10-21 NOTE — Progress Notes (Addendum)
[facility]    Provider:  Dr Jaynee Eagles Referring Provider: Merrilee Seashore, MD Primary Care Physician:  Merrilee Seashore, MD  CC:  Syncope and Dizziness  HPI:  Brooke Harrison is a 56 y.o. female here as a referral from Dr. Ashby Dawes for syncope.  Patient was seen in the emergency room February 15 after an episode of severe vertigo and syncope. She has had headaches since HS. Generally she takes goody powder which helps. Headaches progressively worse and more frequent. She feels she has pressure in the head across the forehead. Just pressure, no pounding or throbbing. She has vision changes. Here with her husband of 37 years who also provides most information. Headaches on the sides of the head. >1x a week. They last all day. They can be 6/10 with blurry vision. She has sensitivity to light, dark and quiet helps. Stress and dehydration makes it worse. +nausea. She has persistent dizziness like "bumper cars". No falls. She had severe dizziness earlier this month. She has been tired. She got up out of bed, she leaned down and picked her dog up and then woke up on the floor. She sort of remembers falling, such as room spinning, she couldn't walk or crawl. First incident of dizziness. Extremely nauseating and vomiting. Lasted until the hospital until being treated. Sister had a stroke. No other focal associated events. Since then she has more nausea, difficulty reading and a strange pull on the right eye. Vision is "off". She has swishing in the ears. No other focal neurologic deficits, associated symptoms, inciting events or modifiable factors.   Reviewed notes, labs and imaging from outside physicians, which showed:   Personally reviewed images MRI of the brain and agree with the following:  IMPRESSION: 1. 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 Such entities are described to occur  spontaneously, or can be seen as a result of increased intracranial pressure (such as idiopathic intracranial hypertension - AKA pseudotumor cerebri). Although generally thought to be incidental findings, such lesions have sometimes been implicated in symptoms (such as seizure when there is cerebral temporal lobe involvement). In this case it is unclear whether there might be mild gliosis in the affected cerebellar tissue (series 7, image 7), which present would seem to confirm that this is a chronic process. 2. Superimposed partially empty sella, which is often a normal anatomic variation but can also be seen in pseudotumor cerebri. 3. Otherwise normal MRI appearance of the brain. 4. Mild bilateral mastoid air cell effusions which are nonspecific, but most often are postinflammatory in nature.  CBC with differential and CMP normal.   Review of Systems: Patient complains of symptoms per HPI as well as the following symptoms: fatigue, blurred vision, eye pain, confusion,  . Pertinent negatives per HPI. All others negative.   Social History   Social History  . Marital status: Married    Spouse name: [name]  . Number of children: 2  . Years of education: College   Occupational History  . [place] History Main Topics  . Smoking status: Former Research scientist (life sciences)  . Smokeless tobacco: Never Used     Comment: Quit early 2000s  . Alcohol use Yes     Comment: 1 every 5 months  . Drug use: No  . Sexual activity: Not on file   Other Topics Concern  . Not on file   Social History Narrative   Lives at home w/  her husband and son   Right-handed   Caffeine: 2 cups of coffee per day    Family History  Problem Relation Age of Onset  . Heart disease Father   . Heart defect Sister     Enlarged heart  . Stroke Sister   . Colon cancer Neg Hx     Past Medical History:  Diagnosis Date  . Allergy   . Anxiety   . Arthritis   . Hypertension   . Migraine     History  reviewed. No pertinent surgical history.  Current Outpatient Prescriptions  Medication Sig Dispense Refill  . Aspirin-Acetaminophen-Caffeine (GOODY HEADACHE PO) Take by mouth as needed.    Marland Kitchen FLUoxetine (PROZAC) 40 MG capsule Take 40 mg by mouth daily.    Marland Kitchen amLODipine (NORVASC) 5 MG tablet Take 5 mg by mouth daily.    . meclizine (ANTIVERT) 25 MG tablet Take 1 tablet (25 mg total) by mouth 3 (three) times daily as needed for dizziness. (Patient not taking: Reported on 10/21/2016) 12 tablet 0  . ondansetron (ZOFRAN) 4 MG tablet Take 1 tablet (4 mg total) by mouth every 6 (six) hours. (Patient not taking: Reported on 10/21/2016) 10 tablet 0   No current facility-administered medications for this visit.     Allergies as of 10/21/2016  . (No Known Allergies)    Vitals: BP (!) 145/87 (BP Location: Left Arm)   Pulse 64   Ht 5\' 9"  (1.753 m)   Wt 199 lb 12.8 oz (90.6 kg)   BMI 29.51 kg/m  Last Weight:  Wt Readings from Last 1 Encounters:  10/21/16 199 lb 12.8 oz (90.6 kg)   Last Height:   Ht Readings from Last 1 Encounters:  10/21/16 5\' 9"  (1.753 m)   Physical exam: Exam: Gen: NAD, conversant, well nourised, overweight, well groomed                     CV: RRR, no MRG. No Carotid Bruits. No peripheral edema, warm, nontender Eyes: Conjunctivae clear without exudates or hemorrhage  Neuro: Detailed Neurologic Exam  Speech:    Speech is normal; fluent and spontaneous with normal comprehension.  Cognition:    The patient is oriented to person, place, and time;     recent and remote memory intact;     language fluent;     normal attention, concentration,     fund of knowledge Cranial Nerves:    The pupils are equal, round, and reactive to light. The fundi are normal and spontaneous venous pulsations are present. Visual fields are full to finger confrontation. Extraocular movements are intact. Trigeminal sensation is intact and the muscles of mastication are normal. The face is  symmetric. The palate elevates in the midline. Hearing intact. Voice is normal. Shoulder shrug is normal. The tongue has normal motion without fasciculations.   Coordination:    Normal finger to nose and heel to shin. Normal rapid alternating movements.   Gait:    Heel-toe and tandem gait are normal.   Motor Observation:    No asymmetry, no atrophy, and no involuntary movements noted. Tone:    Normal muscle tone.    Posture:    Posture is normal. normal erect    Strength:    Strength is V/V in the upper and lower limbs.      Sensation: intact to LT     Reflex Exam:  DTR's:    Deep tendon reflexes in the upper and lower extremities are normal  bilaterally.   Toes:    The toes are downgoing bilaterally.   Clonus:    Clonus is absent.       Assessment/Plan:  56 year old with chronic dizziness and pressure headaches with syncope. 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   Lumbar puncture for opening pressure and discussed starting diamox afterwards. NSY referral for evaluation of the giant arachnoid granulation and any surgical options considering she is dizzy, with headache, and syncopal events  Discussed:To prevent or relieve headaches, try the following: Cool Compress. Lie down and place a cool compress on your head.  Avoid headache triggers. If certain foods or odors seem to have triggered your migraines in the past, avoid them. A headache diary might help you identify triggers.  Include physical activity in your daily routine. Try a daily walk or other moderate aerobic exercise.  Manage stress. Find healthy ways to cope with the stressors, such as delegating tasks on your to-do list.  Practice relaxation techniques. Try deep breathing, yoga, massage and visualization.  Eat regularly. Eating  regularly scheduled meals and maintaining a healthy diet might help prevent headaches. Also, drink plenty of fluids.  Follow a regular sleep schedule. Sleep deprivation might contribute to headaches Consider biofeedback. With this mind-body technique, you learn to control certain bodily functions - such as muscle tension, heart rate and blood pressure - to prevent headaches or reduce headache pain.    Proceed to emergency room if you experience new or worsening symptoms or symptoms do not resolve, if you have new neurologic symptoms or if headache is severe, or for any concerning symptom.   Orders Placed This Encounter  Procedures  . DG FLUORO GUIDED LOC OF NEEDLE/CATH TIP FOR SPINAL INJECT RT  . Ambulatory referral to Neurosurgery     Sarina Ill, MD  [facility] [address] [place], [place] [address]  Phone [phone] Fax [phone]

## 2016-10-21 NOTE — Patient Instructions (Signed)
Remember to drink plenty of fluid, eat healthy meals and do not skip any meals. Try to eat protein with a every meal and eat a healthy snack such as fruit or nuts in between meals. Try to keep a regular sleep-wake schedule and try to exercise daily, particularly in the form of walking, 20-30 minutes a day, if you can.   As far as your medications are concerned, I would like to suggest: Possibly Diamox(Acetazolamide) or Topamax (Topiramate) As far as diagnostic testing: Lumbar puncture for opening pressure   I would like to see you back in 8 weeks, sooner if we need to. Please call us with any interim questions, concerns, problems, updates or refill requests.    Our phone number is [phone]. We also have an after hours call service for urgent matters and there is a physician on-call for urgent questions. For any emergencies you know to call 911 or go to the nearest emergency room   Idiopathic Intracranial Hypertension Idiopathic intracranial hypertension (IIH) is a condition that increases pressure around the brain. The fluid that surrounds the brain and spinal cord (cerebrospinal fluid, CSF) increases and causes the pressure. Idiopathic means that the cause of this condition is not known. IIH affects the brain and spinal cord (is a neurological disorder). If this condition is not treated, it can cause vision loss or blindness. What increases the risk? You are more likely to develop this condition if:  You are severely overweight (obese).  You are a woman who has not gone through menopause.  You take certain medicines, such as birth control or steroids. What are the signs or symptoms? Symptoms of IIH include:  Headaches. This is the most common symptom.  Pain in the shoulders or neck.  Nausea and vomiting.  A "rushing water" or pulsing sound within the ears (pulsatile tinnitus).  Double vision.  Blurred vision.  Brief episodes of complete vision loss. How is this  diagnosed? This condition may be diagnosed based on:  Your symptoms.  Your medical history.  CT scan of the brain.  MRI of the brain.  Magnetic resonance venogram (MRV) to check veins in the brain.  Diagnostic lumbar puncture. This is a procedure to remove and examine a sample of cerebrospinal fluid. This procedure can determine whether too much fluid may be causing IIH.  A thorough eye exam to check for swelling or nerve damage in the eyes. How is this treated? Treatment for this condition depends on your symptoms. The goal of treatment is to decrease the pressure around your brain. Common treatments include:  Medicines to decrease the production of spinal fluid and lower the pressure within your skull.  Medicines to prevent or treat headaches.  Surgery to place drains (shunts) in your brain to remove excess fluid.  Lumbar puncture to remove excess cerebrospinal fluid. Follow these instructions at home:  If you are overweight or obese, work with your health care provider to lose weight.  Take over-the-counter and prescription medicines only as told by your health care provider.  Do not drive or use heavy machinery while taking medicines that can make you sleepy.  Keep all follow-up visits as told by your health care provider. This is important. Contact a health care provider if:  You have changes in your vision, such as:  Double vision.  Not being able to see colors (color vision). Get help right away if:  You have any of the following symptoms and they get worse or do not get better.  Headaches.  Nausea.  Vomiting.  Vision changes or difficulty seeing. Summary  Idiopathic intracranial hypertension (IIH) is a condition that increases pressure around the brain. The cause is not known (is idiopathic).  The most common symptom of IIH is headaches.  Treatment may include medicines or surgery to relieve the pressure on your brain. This information is not  intended to replace advice given to you by your health care provider. Make sure you discuss any questions you have with your health care provider. Document Released: 10/19/2001 Document Revised: 07/01/2016 Document Reviewed: 07/01/2016 Elsevier Interactive Patient Education  2017 Elsevier Inc.  Acetazolamide tablets What is this medicine? ACETAZOLAMIDE (a set a ZOLE a mide) is used to treat glaucoma and some seizure disorders. It may be used to treat edema or swelling from heart failure or from other medicines. This medicine is also used to treat and to prevent altitude or mountain sickness. This medicine may be used for other purposes; ask your health care provider or pharmacist if you have questions. COMMON BRAND NAME(S): Diamox What should I tell my health care provider before I take this medicine? They need to know if you have any of these conditions: -diabetes -kidney disease -liver disease -lung disease -an unusual or allergic reaction to acetazolamide, sulfa drugs, other medicines, foods, dyes, or preservatives -pregnant or trying to get pregnant -breast-feeding How should I use this medicine? Take this medicine by mouth with a glass of water. Follow the directions on the prescription label. Take this medicine with food if it upsets your stomach. Take your doses at regular intervals. Do not take your medicine more often than directed. Do not stop taking except on your doctor's advice. Talk to your pediatrician regarding the use of this medicine in children. Special care may be needed. Patients over 47 years old may have a stronger reaction and need a smaller dose. Overdosage: If you think you have taken too much of this medicine contact a poison control center or emergency room at once. NOTE: This medicine is only for you. Do not share this medicine with others. What if I miss a dose? If you miss a dose, take it as soon as you can. If it is almost time for your next dose, take only  that dose. Do not take double or extra doses. What may interact with this medicine? Do not take this medicine with any of the following medications: -methazolamide This medicine may also interact with the following medications: -aspirin and aspirin-like medicines -cyclosporine -lithium -medicine for diabetes -methenamine -other diuretics -phenytoin -primidone -quinidine -sodium bicarbonate -stimulant medicines like dextroamphetamine This list may not describe all possible interactions. Give your health care provider a list of all the medicines, herbs, non-prescription drugs, or dietary supplements you use. Also tell them if you smoke, drink alcohol, or use illegal drugs. Some items may interact with your medicine. What should I watch for while using this medicine? Visit your doctor or health care professional for regular checks on your progress. You will need blood work done regularly. If you are diabetic, check your blood sugar as directed. You may need to be on a special diet while taking this medicine. Ask your doctor. Also, ask how many glasses of fluid you need to drink a day. You must not get dehydrated. You may get drowsy or dizzy. Do not drive, use machinery, or do anything that needs mental alertness until you know how this medicine affects you. Do not stand or sit up quickly, especially if you are  an older patient. This reduces the risk of dizzy or fainting spells. This medicine can make you more sensitive to the sun. Keep out of the sun. If you cannot avoid being in the sun, wear protective clothing and use sunscreen. Do not use sun lamps or tanning beds/booths. What side effects may I notice from receiving this medicine? Side effects that you should report to your doctor or health care professional as soon as possible: -allergic reactions like skin rash, itching or hives, swelling of the face, lips, or tongue -breathing problems -confusion, depression -dark  urine -fever -numbness, tingling in hands or feet -redness, blistering, peeling or loosening of the skin, including inside the mouth -ringing in the ears -seizures -unusually weak or tired -yellowing of the eyes or skin Side effects that usually do not require medical attention (report to your doctor or health care professional if they continue or are bothersome): -change in taste -diarrhea -headache -loss of appetite -nausea, vomiting -passing urine more often This list may not describe all possible side effects. Call your doctor for medical advice about side effects. You may report side effects to FDA at 1-800-FDA-1088. Where should I keep my medicine? Keep out of the reach of children. Store at room temperature between 20 and 25 degrees C (68 and 77 degrees F). Throw away any unused medicine after the expiration date. NOTE: This sheet is a summary. It may not cover all possible information. If you have questions about this medicine, talk to your doctor, pharmacist, or health care provider.  2018 Elsevier/Gold Standard (2007-11-02 10:59:40)

## 2016-10-21 NOTE — Addendum Note (Signed)
Addended by: Sarina Ill B on: 10/21/2016 07:51 PM   Modules accepted: Orders

## 2016-11-04 DIAGNOSIS — I10 Essential (primary) hypertension: Secondary | ICD-10-CM | POA: Diagnosis not present

## 2016-11-04 DIAGNOSIS — Q019 Encephalocele, unspecified: Secondary | ICD-10-CM | POA: Diagnosis not present

## 2016-11-04 DIAGNOSIS — Z6829 Body mass index (BMI) 29.0-29.9, adult: Secondary | ICD-10-CM | POA: Diagnosis not present

## 2016-11-05 ENCOUNTER — Ambulatory Visit
Admission: RE | Admit: 2016-11-05 | Discharge: 2016-11-05 | Disposition: A | Discharge status: Home / Self Care | Payer: BLUE CROSS/BLUE SHIELD | Source: Ambulatory Visit | Attending: Neurology | Admitting: Neurology

## 2016-11-05 DIAGNOSIS — R51 Headache: Secondary | ICD-10-CM | POA: Diagnosis not present

## 2016-11-05 DIAGNOSIS — G932 Benign intracranial hypertension: Secondary | ICD-10-CM

## 2016-11-05 MED ORDER — DIAZEPAM 5 MG PO TABS
10.0000 mg | ORAL_TABLET | Freq: Once | ORAL | Status: AC
  Administered 2016-11-05: 10 mg via ORAL

## 2016-11-05 NOTE — Discharge Instructions (Signed)

## 2016-11-05 NOTE — Progress Notes (Signed)
Discharge instructions explained to pt. 

## 2016-11-11 ENCOUNTER — Telehealth: Payer: Self-pay | Admitting: *Deleted

## 2016-11-11 NOTE — Telephone Encounter (Signed)
Per Dr Jaynee Eagles, LVM informing patient that her LP opening pressure was perfectly normal, so there is no intracranial elevated pressure. Left number for any questions.

## 2016-11-24 ENCOUNTER — Telehealth: Payer: Self-pay

## 2016-11-24 NOTE — Telephone Encounter (Signed)
Received faxed consult from [facility]. Per Dr. 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 Do not think it relates well to her previous episode of syncope or vertigo. Nothing should be done to this other than observation. Would like to re-image in 6 months to evaluate for any change and plan on seeing her back after the follow-up MRI scan." Placed in Dr. Cathren Laine box for review. Copy to medical records to be scanned into pt's chart.

## 2016-12-16 ENCOUNTER — Ambulatory Visit (INDEPENDENT_AMBULATORY_CARE_PROVIDER_SITE_OTHER): Payer: BLUE CROSS/BLUE SHIELD | Admitting: Neurology

## 2016-12-16 ENCOUNTER — Encounter: Payer: Self-pay | Admitting: Neurology

## 2016-12-16 ENCOUNTER — Encounter (INDEPENDENT_AMBULATORY_CARE_PROVIDER_SITE_OTHER): Payer: Self-pay

## 2016-12-16 VITALS — BP 128/85 | HR 67 | Ht 69.0 in | Wt 200.8 lb

## 2016-12-16 DIAGNOSIS — G43109 Migraine with aura, not intractable, without status migrainosus: Secondary | ICD-10-CM | POA: Diagnosis not present

## 2016-12-16 DIAGNOSIS — G43809 Other migraine, not intractable, without status migrainosus: Secondary | ICD-10-CM

## 2016-12-16 MED ORDER — SUMATRIPTAN SUCCINATE 100 MG PO TABS: 100.0000 mg | ORAL_TABLET | Freq: Once | ORAL | 12 refills | Status: DC | PRN

## 2016-12-16 MED ORDER — ONDANSETRON 4 MG PO TBDP: 4.0000 mg | ORAL_TABLET | Freq: Three times a day (TID) | ORAL | 11 refills | Status: DC | PRN

## 2016-12-16 MED ORDER — TOPIRAMATE 25 MG PO TABS: 50.0000 mg | ORAL_TABLET | Freq: Every day | ORAL | 12 refills | Status: DC

## 2016-12-16 NOTE — Progress Notes (Signed)
[facility]    Provider:  Dr Jaynee Eagles Referring Provider: Merrilee Seashore, MD Primary Care Physician:  Merrilee Seashore, MD  CC:  Syncope and Dizziness  Interval history: Patient returns for follow up. LP showed normal opening pressure (14). Was seen by [place] [facility] (Per Dr. 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 Do not think it relates well to her previous episode of syncope or vertigo. Nothing should be done to this other than observation. Would like to re-image in 6 months to evaluate for any change and plan on seeing her back after the follow-up MRI scan." ). Discussed vestibular migraines and dizziness associated with migraines.   HPI:  Brooke Harrison is a 56 y.o. female here as a referral from Dr. Ashby Dawes for syncope.  Patient was seen in the emergency room February 15 after an episode of severe vertigo and syncope. She has had headaches since HS. Generally she takes goody powder which helps. Headaches progressively worse and more frequent. She feels she has pressure in the head across the forehead. Just pressure, no pounding or throbbing. She has vision changes. Here with her husband of 37 years who also provides most information. Headaches on the sides of the head. >1x a week. They last all day. They can be 6/10 with blurry vision. She has sensitivity to light, dark and quiet helps. Stress and dehydration makes it worse. +nausea. She has persistent dizziness like "bumper cars". No falls. She had severe dizziness earlier this month. She has been tired. She got up out of bed, she leaned down and picked her dog up and then woke up on the floor. She sort of remembers falling, such as room spinning, she couldn't walk or crawl. First incident of dizziness. Extremely nauseating and vomiting. Lasted until the hospital until being treated. Sister had a stroke. No other focal  associated events. Since then she has more nausea, difficulty reading and a strange pull on the right eye. Vision is "off". She has swishing in the ears. No other focal neurologic deficits, associated symptoms, inciting events or modifiable factors.   Reviewed notes, labs and imaging from outside physicians, which showed:   Personally reviewed images MRI of the brain and agree with the following:  IMPRESSION: 1. 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 Such entities are described to occur spontaneously, or can be seen as a result of increased intracranial pressure (such as idiopathic intracranial hypertension - AKA pseudotumor cerebri). Although generally thought to be incidental findings, such lesions have sometimes been implicated in symptoms (such as seizure when there is cerebral temporal lobe involvement). In this case it is unclear whether there might be mild gliosis in the affected cerebellar tissue (series 7, image 7), which present would seem to confirm that this is a chronic process. 2. Superimposed partially empty sella, which is often a normal anatomic variation but can also be seen in pseudotumor cerebri. 3. Otherwise normal MRI appearance of the brain. 4. Mild bilateral mastoid air cell effusions which are nonspecific, but most often are postinflammatory in nature.  CBC with differential and CMP normal.   Review of Systems: Patient complains of symptoms per HPI as well as the following symptoms: fatigue, blurred vision, eye pain, confusion,  . Pertinent negatives per HPI. All others negative.    Social History   Social History  . Marital status: Married  Spouse name: [name]  . Number of children: 2  . Years of education: College   Occupational History  . [place] History Main Topics  . Smoking status: Former Research scientist (life sciences)  . Smokeless tobacco: Never Used       Comment: Quit early 2000s  . Alcohol use Yes     Comment: 1 every 5 months  . Drug use: No  . Sexual activity: Not on file   Other Topics Concern  . Not on file   Social History Narrative   Lives at home w/ her husband and son   Right-handed   Caffeine: 2 cups of coffee per day    Family History  Problem Relation Age of Onset  . Heart disease Father   . Heart defect Sister     Enlarged heart  . Stroke Sister   . Colon cancer Neg Hx     Past Medical History:  Diagnosis Date  . Allergy   . Anxiety   . Arthritis   . Hypertension   . Migraine     Past Surgical History:  Procedure Laterality Date  . NO PAST SURGERIES      Current Outpatient Prescriptions  Medication Sig Dispense Refill  . amLODipine (NORVASC) 5 MG tablet Take 5 mg by mouth daily.    . Aspirin-Acetaminophen-Caffeine (GOODY HEADACHE PO) Take by mouth as needed.    Marland Kitchen FLUoxetine (PROZAC) 40 MG capsule Take 40 mg by mouth daily.     No current facility-administered medications for this visit.     Allergies as of 12/16/2016 - Review Complete 12/16/2016  Allergen Reaction Noted  . Adhesive [tape] Other (See Comments) 11/05/2016    Vitals: BP 128/85   Pulse 67   Ht 5\' 9"  (1.753 m)   Wt 200 lb 12.8 oz (91.1 kg)   BMI 29.65 kg/m  Last Weight:  Wt Readings from Last 1 Encounters:  12/16/16 200 lb 12.8 oz (91.1 kg)   Last Height:   Ht Readings from Last 1 Encounters:  12/16/16 5\' 9"  (1.753 m)       Neuro: Detailed Neurologic Exam  Speech:    Speech is normal; fluent and spontaneous with normal comprehension.  Cognition:    The patient is oriented to person, place, and time;     recent and remote memory intact;     language fluent;     normal attention, concentration,     fund of knowledge Cranial Nerves:    The pupils are equal, round, and reactive to light. The fundi are normal and spontaneous venous pulsations are present. Visual fields are full to finger confrontation.  Extraocular movements are intact. Trigeminal sensation is intact and the muscles of mastication are normal. The face is symmetric. The palate elevates in the midline. Hearing intact. Voice is normal. Shoulder shrug is normal. The tongue has normal motion without fasciculations.   Coordination:    Normal finger to nose and heel to shin. Normal rapid alternating movements.   Gait:    Heel-toe and tandem gait are normal.   Motor Observation:    No asymmetry, no atrophy, and no involuntary movements noted. Tone:    Normal muscle tone.    Posture:    Posture is normal. normal erect    Strength:    Strength is V/V in the upper and lower limbs.      Sensation: intact to LT     Reflex Exam:  DTR's:    Deep tendon reflexes in  the upper and lower extremities are normal bilaterally.   Toes:    The toes are downgoing bilaterally.   Clonus:    Clonus is absent.       Assessment/Plan:  56 year old with chronic dizziness and pressure headaches with syncope. 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 Discussed vestibular migraines today.   Lumbar puncture for opening pressure: normal NSY referral for evaluation of the giant arachnoid granulation and any surgical options considering she is dizzy, with headache, and syncopal events: Likely congenital, no relation to symptoms, follow.  Discussed:To prevent or relieve headaches, try the following:  Cool Compress. Lie down and place a cool compress on your head.   Avoid headache triggers. If certain foods or odors seem to have triggered your migraines in the past, avoid them. A headache diary might help you identify triggers.   Include physical activity in your daily routine.  Try a daily walk or other moderate aerobic exercise.   Manage stress. Find healthy ways to cope with the stressors, such as delegating tasks on your to-do list.   Practice relaxation techniques. Try deep breathing, yoga, massage and visualization.   Eat regularly. Eating regularly scheduled meals and maintaining a healthy diet might help prevent headaches. Also, drink plenty of fluids.   Follow a regular sleep schedule. Sleep deprivation might contribute to headaches  Consider biofeedback. With this mind-body technique, you learn to control certain bodily functions - such as muscle tension, heart rate and blood pressure - to prevent headaches or reduce headache pain.    Proceed to emergency room if you experience new or worsening symptoms or symptoms do not resolve, if you have new neurologic symptoms or if headache is severe, or for any concerning symptom.  Sarina Ill, MD  [facility] [address] [place], [place] [address]  Phone [phone] Fax [phone]  A total of 25 minutes was spent face-to-face with this patient. Over half this time was spent on counseling patient on the migraine with dizziness diagnosis and different diagnostic and therapeutic options available.

## 2016-12-16 NOTE — Patient Instructions (Signed)
Remember to drink plenty of fluid, eat healthy meals and do not skip any meals. Try to eat protein with a every meal and eat a healthy snack such as fruit or nuts in between meals. Try to keep a regular sleep-wake schedule and try to exercise daily, particularly in the form of walking, 20-30 minutes a day, if you can.   As far as your medications are concerned, I would like to suggest:  Topiramate: Start with 25mg  at bedtime then in one week increase to 50mg (two pills) Email in about 3 weeks and let me know how things are going Imitrex:Please take one tablet at the onset of your headache/dizziness. If it does not improve the symptoms please take one additional tablet. Do not take more then 2 tablets in 24hrs. Do not take use more then 2 to 3 times in a week. May take zofran with the imitrex or separately for dizziness and nausea.   I would like to see you back in 6 months, sooner if we need to. Please call us with any interim questions, concerns, problems, updates or refill requests.   Our phone number is [phone]. We also have an after hours call service for urgent matters and there is a physician on-call for urgent questions. For any emergencies you know to call 911 or go to the nearest emergency room  Topiramate tablets What is this medicine? TOPIRAMATE (toe PYRE a mate) is used to treat seizures in adults or children with epilepsy. It is also used for the prevention of migraine headaches. This medicine may be used for other purposes; ask your health care provider or pharmacist if you have questions. COMMON BRAND NAME(S): Topamax, Topiragen What should I tell my health care provider before I take this medicine? They need to know if you have any of these conditions: -bleeding disorders -cirrhosis of the liver or liver disease -diarrhea -glaucoma -kidney stones or kidney disease -low blood counts, like low white cell, platelet, or red cell counts -lung disease like asthma, obstructive  pulmonary disease, emphysema -metabolic acidosis -on a ketogenic diet -schedule for surgery or a procedure -suicidal thoughts, plans, or attempt; a previous suicide attempt by you or a family member -an unusual or allergic reaction to topiramate, other medicines, foods, dyes, or preservatives -pregnant or trying to get pregnant -breast-feeding How should I use this medicine? Take this medicine by mouth with a glass of water. Follow the directions on the prescription label. Do not crush or chew. You may take this medicine with meals. Take your medicine at regular intervals. Do not take it more often than directed. Talk to your pediatrician regarding the use of this medicine in children. Special care may be needed. While this drug may be prescribed for children as young as 67 years of age for selected conditions, precautions do apply. Overdosage: If you think you have taken too much of this medicine contact a poison control center or emergency room at once. NOTE: This medicine is only for you. Do not share this medicine with others. What if I miss a dose? If you miss a dose, take it as soon as you can. If your next dose is to be taken in less than 6 hours, then do not take the missed dose. Take the next dose at your regular time. Do not take double or extra doses. What may interact with this medicine? Do not take this medicine with any of the following medications: -probenecid This medicine may also interact with the following medications: -acetazolamide -  alcohol -amitriptyline -aspirin and aspirin-like medicines -birth control pills -certain medicines for depression -certain medicines for seizures -certain medicines that treat or prevent blood clots like warfarin, enoxaparin, dalteparin, apixaban, dabigatran, and rivaroxaban -digoxin -hydrochlorothiazide -lithium -medicines for pain, sleep, or muscle relaxation -metformin -methazolamide -NSAIDS, medicines for pain and inflammation,  like ibuprofen or naproxen -pioglitazone -risperidone This list may not describe all possible interactions. Give your health care provider a list of all the medicines, herbs, non-prescription drugs, or dietary supplements you use. Also tell them if you smoke, drink alcohol, or use illegal drugs. Some items may interact with your medicine. What should I watch for while using this medicine? Visit your doctor or health care professional for regular checks on your progress. Do not stop taking this medicine suddenly. This increases the risk of seizures if you are using this medicine to control epilepsy. Wear a medical identification bracelet or chain to say you have epilepsy or seizures, and carry a card that lists all your medicines. This medicine can decrease sweating and increase your body temperature. Watch for signs of deceased sweating or fever, especially in children. Avoid extreme heat, hot baths, and saunas. Be careful about exercising, especially in hot weather. Contact your health care provider right away if you notice a fever or decrease in sweating. You should drink plenty of fluids while taking this medicine. If you have had kidney stones in the past, this will help to reduce your chances of forming kidney stones. If you have stomach pain, with nausea or vomiting and yellowing of your eyes or skin, call your doctor immediately. You may get drowsy, dizzy, or have blurred vision. Do not drive, use machinery, or do anything that needs mental alertness until you know how this medicine affects you. To reduce dizziness, do not sit or stand up quickly, especially if you are an older patient. Alcohol can increase drowsiness and dizziness. Avoid alcoholic drinks. If you notice blurred vision, eye pain, or other eye problems, seek medical attention at once for an eye exam. The use of this medicine may increase the chance of suicidal thoughts or actions. Pay special attention to how you are responding while  on this medicine. Any worsening of mood, or thoughts of suicide or dying should be reported to your health care professional right away. This medicine may increase the chance of developing metabolic acidosis. If left untreated, this can cause kidney stones, bone disease, or slowed growth in children. Symptoms include breathing fast, fatigue, loss of appetite, irregular heartbeat, or loss of consciousness. Call your doctor immediately if you experience any of these side effects. Also, tell your doctor about any surgery you plan on having while taking this medicine since this may increase your risk for metabolic acidosis. Birth control pills may not work properly while you are taking this medicine. Talk to your doctor about using an extra method of birth control. Women who become pregnant while using this medicine may enroll in the [place] Pregnancy Registry by calling [phone]. This registry collects information about the safety of antiepileptic drug use during pregnancy. What side effects may I notice from receiving this medicine? Side effects that you should report to your doctor or health care professional as soon as possible: -allergic reactions like skin rash, itching or hives, swelling of the face, lips, or tongue -decreased sweating and/or rise in body temperature -depression -difficulty breathing, fast or irregular breathing patterns -difficulty speaking -difficulty walking or controlling muscle movements -hearing impairment -redness, blistering, peeling or  loosening of the skin, including inside the mouth -tingling, pain or numbness in the hands or feet -unusual bleeding or bruising -unusually weak or tired -worsening of mood, thoughts or actions of suicide or dying Side effects that usually do not require medical attention (report to your doctor or health care professional if they continue or are bothersome): -altered taste -back pain, joint or muscle  aches and pains -diarrhea, or constipation -headache -loss of appetite -nausea -stomach upset, indigestion -tremors This list may not describe all possible side effects. Call your doctor for medical advice about side effects. You may report side effects to FDA at 1-800-FDA-1088. Where should I keep my medicine? Keep out of the reach of children. Store at room temperature between 15 and 30 degrees C (59 and 86 degrees F) in a tightly closed container. Protect from moisture. Throw away any unused medicine after the expiration date. NOTE: This sheet is a summary. It may not cover all possible information. If you have questions about this medicine, talk to your doctor, pharmacist, or health care provider.  2018 Elsevier/Gold Standard (2013-08-14 23:17:57)  Sumatriptan tablets What is this medicine? SUMATRIPTAN (soo ma TRIP tan) is used to treat migraines with or without aura. An aura is a strange feeling or visual disturbance that warns you of an attack. It is not used to prevent migraines. This medicine may be used for other purposes; ask your health care provider or pharmacist if you have questions. COMMON BRAND NAME(S): Imitrex, Migraine Pack What should I tell my health care provider before I take this medicine? They need to know if you have any of these conditions: -circulation problems in fingers and toes -diabetes -heart disease -high blood pressure -high cholesterol -history of irregular heartbeat -history of stroke -kidney disease -liver disease -postmenopausal or surgical removal of uterus and ovaries -seizures -smoke tobacco -stomach or intestine problems -an unusual or allergic reaction to sumatriptan, other medicines, foods, dyes, or preservatives -pregnant or trying to get pregnant -breast-feeding How should I use this medicine? Take this medicine by mouth with a glass of water. Follow the directions on the prescription label. This medicine is taken at the first  symptoms of a migraine. It is not for everyday use. If your migraine headache returns after one dose, you can take another dose as directed. You must leave at least 2 hours between doses, and do not take more than 100 mg as a single dose. Do not take more than 200 mg total in any 24 hour period. If there is no improvement at all after the first dose, do not take a second dose without talking to your doctor or health care professional. Do not take your medicine more often than directed. Talk to your pediatrician regarding the use of this medicine in children. Special care may be needed. Overdosage: If you think you have taken too much of this medicine contact a poison control center or emergency room at once. NOTE: This medicine is only for you. Do not share this medicine with others. What if I miss a dose? This does not apply; this medicine is not for regular use. What may interact with this medicine? Do not take this medicine with any of the following medicines: -cocaine -ergot alkaloids like dihydroergotamine, ergonovine, ergotamine, methylergonovine -feverfew -MAOIs like Carbex, Eldepryl, Marplan, Nardil, and Parnate -other medicines for migraine headache like almotriptan, eletriptan, frovatriptan, naratriptan, rizatriptan, zolmitriptan -tryptophan This medicine may also interact with the following medications: -certain medicines for depression, anxiety, or psychotic disturbances This  list may not describe all possible interactions. Give your health care provider a list of all the medicines, herbs, non-prescription drugs, or dietary supplements you use. Also tell them if you smoke, drink alcohol, or use illegal drugs. Some items may interact with your medicine. What should I watch for while using this medicine? Only take this medicine for a migraine headache. Take it if you get warning symptoms or at the start of a migraine attack. It is not for regular use to prevent migraine attacks. You may  get drowsy or dizzy. Do not drive, use machinery, or do anything that needs mental alertness until you know how this medicine affects you. To reduce dizzy or fainting spells, do not sit or stand up quickly, especially if you are an older patient. Alcohol can increase drowsiness, dizziness and flushing. Avoid alcoholic drinks. Smoking cigarettes may increase the risk of heart-related side effects from using this medicine. If you take migraine medicines for 10 or more days a month, your migraines may get worse. Keep a diary of headache days and medicine use. Contact your healthcare professional if your migraine attacks occur more frequently. What side effects may I notice from receiving this medicine? Side effects that you should report to your doctor or health care professional as soon as possible: -allergic reactions like skin rash, itching or hives, swelling of the face, lips, or tongue -bloody or watery diarrhea -hallucination, loss of contact with reality -pain, tingling, numbness in the face, hands, or feet -seizures -signs and symptoms of a blood clot such as breathing problems; changes in vision; chest pain; severe, sudden headache; pain, swelling, warmth in the leg; trouble speaking; sudden numbness or weakness of the face, arm, or leg -signs and symptoms of a dangerous change in heartbeat or heart rhythm like chest pain; dizziness; fast or irregular heartbeat; palpitations, feeling faint or lightheaded; falls; breathing problems -signs and symptoms of a stroke like changes in vision; confusion; trouble speaking or understanding; severe headaches; sudden numbness or weakness of the face, arm, or leg; trouble walking; dizziness; loss of balance or coordination -stomach pain Side effects that usually do not require medical attention (report to your doctor or health care professional if they continue or are bothersome): -changes in taste -facial flushing -headache -muscle cramps -muscle  pain -nausea, vomiting -weak or tired This list may not describe all possible side effects. Call your doctor for medical advice about side effects. You may report side effects to FDA at 1-800-FDA-1088. Where should I keep my medicine? Keep out of the reach of children. Store at room temperature between 2 and 30 degrees C (36 and 86 degrees F). Throw away any unused medicine after the expiration date. NOTE: This sheet is a summary. It may not cover all possible information. If you have questions about this medicine, talk to your doctor, pharmacist, or health care provider.  2018 Elsevier/Gold Standard (2015-09-12 12:38:23)  Ondansetron oral dissolving tablet What is this medicine? ONDANSETRON (on DAN se tron) is used to treat nausea and vomiting caused by chemotherapy. It is also used to prevent or treat nausea and vomiting after surgery. This medicine may be used for other purposes; ask your health care provider or pharmacist if you have questions. COMMON BRAND NAME(S): Zofran ODT What should I tell my health care provider before I take this medicine? They need to know if you have any of these conditions: -heart disease -history of irregular heartbeat -liver disease -low levels of magnesium or potassium in the blood -an  unusual or allergic reaction to ondansetron, granisetron, other medicines, foods, dyes, or preservatives -pregnant or trying to get pregnant -breast-feeding How should I use this medicine? These tablets are made to dissolve in the mouth. Do not try to push the tablet through the foil backing. With dry hands, peel away the foil backing and gently remove the tablet. Place the tablet in the mouth and allow it to dissolve, then swallow. While you may take these tablets with water, it is not necessary to do so. Talk to your pediatrician regarding the use of this medicine in children. Special care may be needed. Overdosage: If you think you have taken too much of this medicine  contact a poison control center or emergency room at once. NOTE: This medicine is only for you. Do not share this medicine with others. What if I miss a dose? If you miss a dose, take it as soon as you can. If it is almost time for your next dose, take only that dose. Do not take double or extra doses. What may interact with this medicine? Do not take this medicine with any of the following medications: -apomorphine -certain medicines for fungal infections like fluconazole, itraconazole, ketoconazole, posaconazole, voriconazole -cisapride -dofetilide -dronedarone -pimozide -thioridazine -ziprasidone This medicine may also interact with the following medications: -carbamazepine -certain medicines for depression, anxiety, or psychotic disturbances -fentanyl -linezolid -MAOIs like Carbex, Eldepryl, Marplan, Nardil, and Parnate -methylene blue (injected into a vein) -other medicines that prolong the QT interval (cause an abnormal heart rhythm) -phenytoin -rifampicin -tramadol This list may not describe all possible interactions. Give your health care provider a list of all the medicines, herbs, non-prescription drugs, or dietary supplements you use. Also tell them if you smoke, drink alcohol, or use illegal drugs. Some items may interact with your medicine. What should I watch for while using this medicine? Check with your doctor or health care professional as soon as you can if you have any sign of an allergic reaction. What side effects may I notice from receiving this medicine? Side effects that you should report to your doctor or health care professional as soon as possible: -allergic reactions like skin rash, itching or hives, swelling of the face, lips, or tongue -breathing problems -confusion -dizziness -fast or irregular heartbeat -feeling faint or lightheaded, falls -fever and chills -loss of balance or coordination -seizures -sweating -swelling of the hands and  feet -tightness in the chest -tremors -unusually weak or tired Side effects that usually do not require medical attention (report to your doctor or health care professional if they continue or are bothersome): -constipation or diarrhea -headache This list may not describe all possible side effects. Call your doctor for medical advice about side effects. You may report side effects to FDA at 1-800-FDA-1088. Where should I keep my medicine? Keep out of the reach of children. Store between 2 and 30 degrees C (36 and 86 degrees F). Throw away any unused medicine after the expiration date. NOTE: This sheet is a summary. It may not cover all possible information. If you have questions about this medicine, talk to your doctor, pharmacist, or health care provider.  2018 Elsevier/Gold Standard (2013-05-17 16:21:52)

## 2017-05-11 DIAGNOSIS — Q019 Encephalocele, unspecified: Secondary | ICD-10-CM | POA: Diagnosis not present

## 2017-05-12 DIAGNOSIS — R42 Dizziness and giddiness: Secondary | ICD-10-CM | POA: Diagnosis not present

## 2017-05-12 DIAGNOSIS — Q019 Encephalocele, unspecified: Secondary | ICD-10-CM | POA: Diagnosis not present

## 2017-05-14 DIAGNOSIS — R030 Elevated blood-pressure reading, without diagnosis of hypertension: Secondary | ICD-10-CM | POA: Diagnosis not present

## 2017-05-14 DIAGNOSIS — Q019 Encephalocele, unspecified: Secondary | ICD-10-CM | POA: Diagnosis not present

## 2017-05-14 DIAGNOSIS — Z6829 Body mass index (BMI) 29.0-29.9, adult: Secondary | ICD-10-CM | POA: Diagnosis not present

## 2017-06-15 ENCOUNTER — Ambulatory Visit: Payer: BLUE CROSS/BLUE SHIELD | Admitting: Neurology

## 2017-06-15 ENCOUNTER — Telehealth: Payer: Self-pay | Admitting: *Deleted

## 2017-06-15 NOTE — Telephone Encounter (Signed)
Patient no show appt 06/15/17 @ 09:00.

## 2017-06-16 ENCOUNTER — Encounter: Payer: Self-pay | Admitting: Neurology

## 2017-12-29 ENCOUNTER — Other Ambulatory Visit: Payer: Self-pay | Admitting: Internal Medicine

## 2017-12-29 DIAGNOSIS — Z1231 Encounter for screening mammogram for malignant neoplasm of breast: Secondary | ICD-10-CM

## 2018-01-14 ENCOUNTER — Ambulatory Visit: Payer: Self-pay

## 2019-11-21 ENCOUNTER — Other Ambulatory Visit: Payer: Self-pay

## 2019-11-21 ENCOUNTER — Ambulatory Visit (HOSPITAL_COMMUNITY)
Admission: EM | Admit: 2019-11-21 | Discharge: 2019-11-21 | Disposition: A | Discharge status: Home / Self Care | Payer: 59 | Attending: Family Medicine | Admitting: Family Medicine

## 2019-11-21 ENCOUNTER — Ambulatory Visit (INDEPENDENT_AMBULATORY_CARE_PROVIDER_SITE_OTHER): Payer: 59

## 2019-11-21 ENCOUNTER — Encounter (HOSPITAL_COMMUNITY): Payer: Self-pay | Admitting: Family Medicine

## 2019-11-21 DIAGNOSIS — R05 Cough: Secondary | ICD-10-CM

## 2019-11-21 DIAGNOSIS — I10 Essential (primary) hypertension: Secondary | ICD-10-CM | POA: Diagnosis not present

## 2019-11-21 DIAGNOSIS — Z79899 Other long term (current) drug therapy: Secondary | ICD-10-CM | POA: Diagnosis not present

## 2019-11-21 DIAGNOSIS — Z20822 Contact with and (suspected) exposure to covid-19: Secondary | ICD-10-CM | POA: Insufficient documentation

## 2019-11-21 DIAGNOSIS — R0602 Shortness of breath: Secondary | ICD-10-CM | POA: Diagnosis not present

## 2019-11-21 DIAGNOSIS — Z87891 Personal history of nicotine dependence: Secondary | ICD-10-CM | POA: Insufficient documentation

## 2019-11-21 DIAGNOSIS — Z8249 Family history of ischemic heart disease and other diseases of the circulatory system: Secondary | ICD-10-CM | POA: Diagnosis not present

## 2019-11-21 DIAGNOSIS — B349 Viral infection, unspecified: Secondary | ICD-10-CM | POA: Insufficient documentation

## 2019-11-21 DIAGNOSIS — Z823 Family history of stroke: Secondary | ICD-10-CM | POA: Insufficient documentation

## 2019-11-21 MED ORDER — ONDANSETRON 4 MG PO TBDP
ORAL_TABLET | ORAL | Status: AC
  Filled 2019-11-21: qty 1

## 2019-11-21 MED ORDER — ONDANSETRON 4 MG PO TBDP: 4.0000 mg | ORAL_TABLET | Freq: Three times a day (TID) | ORAL | 0 refills | Status: DC | PRN

## 2019-11-21 MED ORDER — ONDANSETRON 4 MG PO TBDP
4.0000 mg | ORAL_TABLET | Freq: Once | ORAL | Status: AC
  Administered 2019-11-21: 4 mg via ORAL

## 2019-11-21 MED ORDER — GUAIFENESIN 100 MG/5ML PO LIQD: 100.0000 mg | ORAL | 0 refills | Status: DC | PRN

## 2019-11-21 MED ORDER — BENZONATATE 100 MG PO CAPS: 100.0000 mg | ORAL_CAPSULE | Freq: Three times a day (TID) | ORAL | 0 refills | Status: DC

## 2019-11-21 NOTE — Discharge Instructions (Addendum)
Your x ray was normal We will call you if the covid swab is positive.  I would like for you to start taking mucinex for cough and congestion. Make sure that you are drinking plenty of fluids.  Tessalon pearls for worsening cough as needed. You can take tylenol and ibuprofen as needed for body aches.  Zofran as needed for nausea and vomiting.

## 2019-11-21 NOTE — ED Triage Notes (Signed)
Pt c/o cough, HA, body aches, sore throat, congestion and SOB since Saturday. Reports low grade fever of 99.0 last week. Pt states she traveled in a car with a person who soon after tested positive COVID. Pt has had two negative COVID tests-1 rapid and 1 send out at [facility]. Pt states onset of nausea and diarrhea this morning.

## 2019-11-22 LAB — SARS CORONAVIRUS 2 (TAT 6-24 HRS): SARS Coronavirus 2: NEGATIVE

## 2019-11-22 NOTE — ED Provider Notes (Signed)
[place]    CSN: [id number] Arrival date & time: 11/21/19  [id number]      History   Chief Complaint Chief Complaint  Patient presents with  . Cough  . Shortness of Breath    HPI Brooke Harrison is a 59 y.o. female.   Pt is a 59 year old female that presents today with productive cough, headache, body aches, sore throat, congestion, mild shortness of breath since Saturday.  Reporting low-grade fevers around 99 last week.  Reporting she traveled in the car with a person who tested positive for Covid she has had 2 - Covid screenings.  She had some mild nausea and diarrhea this morning.  She has been using over-the-counter medications as needed for symptoms with mild relief.  History of seasonal allergies, anxiety, arthritis, hypertension and migraines.  ROS per HPI      Past Medical History:  Diagnosis Date  . Allergy   . Anxiety   . Arthritis   . Hypertension   . Migraine     Patient Active Problem List   Diagnosis Date Noted  . Unspecified essential hypertension 04/28/2013    Past Surgical History:  Procedure Laterality Date  . NO PAST SURGERIES      OB History   No obstetric history on file.      Home Medications    Prior to Admission medications   Medication Sig Start Date End Date Taking? Authorizing Provider  amLODipine (NORVASC) 5 MG tablet Take 5 mg by mouth daily.    [provider]  Aspirin-Acetaminophen-Caffeine (GOODY HEADACHE PO) Take by mouth as needed.    [provider]  benzonatate (TESSALON) 100 MG capsule Take 1 capsule (100 mg total) by mouth every 8 (eight) hours. 11/21/19   Loura Halt A, NP  FLUoxetine (PROZAC) 40 MG capsule Take 40 mg by mouth daily.    [provider]  guaiFENesin (ROBITUSSIN) 100 MG/5ML liquid Take 5-10 mLs (100-200 mg total) by mouth every 4 (four) hours as needed for cough. 11/21/19   Loura Halt A, NP  ondansetron (ZOFRAN ODT) 4 MG disintegrating tablet Take 1 tablet (4 mg  total) by mouth every 8 (eight) hours as needed for nausea or vomiting. 11/21/19   Loura Halt A, NP  SUMAtriptan (IMITREX) 100 MG tablet Take 1 tablet (100 mg total) by mouth once as needed. May repeat in 2 hours if headache persists or recurs. 12/16/16   Melvenia Beam, MD  topiramate (TOPAMAX) 25 MG tablet Take 2 tablets (50 mg total) by mouth at bedtime. 12/16/16   Melvenia Beam, MD    Family History Family History  Problem Relation Age of Onset  . Heart disease Father   . Heart defect Sister        Enlarged heart  . Stroke Sister   . Colon cancer Neg Hx     Social History Social History   Tobacco Use  . Smoking status: Former Research scientist (life sciences)  . Smokeless tobacco: Never Used  . Tobacco comment: Quit early 2000s  Substance Use Topics  . Alcohol use: Yes    Comment: 1 every 5 months  . Drug use: No     Allergies   Adhesive [tape]   Review of Systems Review of Systems   Physical Exam Triage Vital Signs ED Triage Vitals  Enc Vitals Group     BP 11/21/19 0853 (!) 151/94     Pulse Rate 11/21/19 0853 79     Resp 11/21/19 0853 18  Temp 11/21/19 0853 97.9 F (36.6 C)     Temp Source 11/21/19 0853 Oral     SpO2 11/21/19 0853 99 %     Weight --      Height --      Head Circumference --      Peak Flow --      Pain Score 11/21/19 0848 5     Pain Loc --      Pain Edu? --      Excl. in [place]? --    No data found.  Updated Vital Signs BP (!) 151/94 (BP Location: Left Arm)   Pulse 79   Temp 97.9 F (36.6 C) (Oral)   Resp 18   SpO2 99%   Visual Acuity Right Eye Distance:   Left Eye Distance:   Bilateral Distance:    Right Eye Near:   Left Eye Near:    Bilateral Near:     Physical Exam Vitals and nursing note reviewed.  Constitutional:      General: She is not in acute distress.    Appearance: Normal appearance. She is not ill-appearing, toxic-appearing or diaphoretic.  HENT:     Head: Normocephalic.     Right Ear: Tympanic membrane and ear canal normal.      Left Ear: Tympanic membrane and ear canal normal.     Nose: Nose normal.     Mouth/Throat:     Pharynx: Oropharynx is clear.  Eyes:     Conjunctiva/sclera: Conjunctivae normal.  Cardiovascular:     Rate and Rhythm: Normal rate and regular rhythm.     Pulses: Normal pulses.     Heart sounds: Normal heart sounds.  Pulmonary:     Effort: Pulmonary effort is normal.     Breath sounds: Normal breath sounds.  Musculoskeletal:        General: Normal range of motion.     Cervical back: Normal range of motion.  Skin:    General: Skin is warm and dry.     Findings: No rash.  Neurological:     Mental Status: She is alert.  Psychiatric:        Mood and Affect: Mood normal.      UC Treatments / Results  Labs (all labs ordered are listed, but only abnormal results are displayed) Labs Reviewed  SARS CORONAVIRUS 2 (TAT 6-24 HRS)    EKG   Radiology DG Chest 2 View  Result Date: 11/21/2019 CLINICAL DATA:  Cough and shortness of breath. EXAM: CHEST - 2 VIEW COMPARISON:  10/08/2016 FINDINGS: The cardiac silhouette, mediastinal and hilar contours are within normal limits and stable. The lungs are clear of an acute process. No pleural effusions or pulmonary lesions. The bony thorax is intact. IMPRESSION: No acute cardiopulmonary findings. Electronically Signed   By: Marijo Sanes M.D.   On: 11/21/2019 09:26    Procedures Procedures (including critical care time)  Medications Ordered in UC Medications  ondansetron (ZOFRAN-ODT) disintegrating tablet 4 mg (4 mg Oral Given 11/21/19 0858)    Initial Impression / Assessment and Plan / UC Course  I have reviewed the triage vital signs and the nursing notes.  Pertinent labs & imaging results that were available during my care of the patient were reviewed by me and considered in my medical decision making (see chart for details).  Clinical Course as of Nov 22 826  Tue Nov 21, 2019  0932 DG Chest 2 View [TB]    Clinical Course User  Index [TB] Rozanna Box,  Sade Hollon A, NP    Viral illness-Covid swab sent for testing with labs pending. Otherwise exam benign. Chest x-ray normal and her vital signs are normal. Prescribed Mucinex and Tessalon Perles for cough and congestion. Tylenol and ibuprofen as needed for body aches, fever.  Zofran for nausea and vomiting Work note given Follow up as needed for continued or worsening symptoms  Final Clinical Impressions(s) / UC Diagnoses   Final diagnoses:  Viral illness     Discharge Instructions     Your x ray was normal We will call you if the covid swab is positive.  I would like for you to start taking mucinex for cough and congestion. Make sure that you are drinking plenty of fluids.  Tessalon pearls for worsening cough as needed. You can take tylenol and ibuprofen as needed for body aches.  Zofran as needed for nausea and vomiting.    ED Prescriptions    Medication Sig Dispense Auth. Provider   guaiFENesin (ROBITUSSIN) 100 MG/5ML liquid Take 5-10 mLs (100-200 mg total) by mouth every 4 (four) hours as needed for cough. 60 mL Tymir Terral A, NP   benzonatate (TESSALON) 100 MG capsule Take 1 capsule (100 mg total) by mouth every 8 (eight) hours. 21 capsule Lasonja Lakins A, NP   ondansetron (ZOFRAN ODT) 4 MG disintegrating tablet Take 1 tablet (4 mg total) by mouth every 8 (eight) hours as needed for nausea or vomiting. 20 tablet Loura Halt A, NP     PDMP not reviewed this encounter.   Orvan July, NP 11/22/19 [phone]

## 2019-12-22 ENCOUNTER — Ambulatory Visit: Payer: 59

## 2019-12-25 ENCOUNTER — Ambulatory Visit: Payer: 59

## 2020-07-04 ENCOUNTER — Other Ambulatory Visit: Payer: Self-pay

## 2020-07-04 ENCOUNTER — Encounter (HOSPITAL_COMMUNITY): Payer: Self-pay

## 2020-07-04 ENCOUNTER — Ambulatory Visit (INDEPENDENT_AMBULATORY_CARE_PROVIDER_SITE_OTHER): Payer: 59

## 2020-07-04 ENCOUNTER — Ambulatory Visit (HOSPITAL_COMMUNITY)
Admission: EM | Admit: 2020-07-04 | Discharge: 2020-07-04 | Disposition: A | Discharge status: Home / Self Care | Payer: 59 | Attending: Family Medicine | Admitting: Family Medicine

## 2020-07-04 DIAGNOSIS — M79644 Pain in right finger(s): Secondary | ICD-10-CM | POA: Diagnosis not present

## 2020-07-04 NOTE — ED Provider Notes (Signed)
[place]   [id number] 07/04/20 Arrival Time: 4008  ASSESSMENT & PLAN:  1. Thumb pain, right     I have personally viewed the imaging studies ordered this visit. No thumb fracture appreciated.  Placed in thumb spica. OTC Aleve BID with food. Activities as tolerated.  Recommend:  Follow-up Information    Roseanne Kaufman, MD.   Specialty: Orthopedic Surgery Why: If worsening or failing to improve as anticipated. Contact information: [address] [place] [address] [phone]                Reviewed expectations re: course of current medical issues. Questions answered. Outlined signs and symptoms indicating need for more acute intervention. Patient verbalized understanding. After Visit Summary given.  SUBJECTIVE: History from: patient. Brooke Harrison is a 59 y.o. female who reports R thumb injury; trying to grab her dog who pulled away; questions hyperextension of R thumb with continuing pain at base. Mild swelling. No extremity sensation changes or weakness. Pain worse with gripping items. Aleve without much relief.   Past Surgical History:  Procedure Laterality Date  . NO PAST SURGERIES        OBJECTIVE:  Vitals:   07/04/20 1330  BP: (!) 151/86  Pulse: 68  Resp: 18  Temp: 97.6 F (36.4 C)  TempSrc: Oral  SpO2: 99%    General appearance: alert; no distress HEENT: [place]; AT Neck: supple with FROM Resp: unlabored respirations Extremities: . RUE: warm with well perfused appearance; fairly well localized moderate tenderness over base of R thumb; without gross deformities; swelling: minimal; bruising: minimal; thumb ROM: limited by reported pain CV: brisk extremity capillary refill of RUE; 2+ radial pulse of RUE. Skin: warm and dry; no visible rashes Neurologic: gait normal; normal sensation of RUE Psychological: alert and cooperative; normal mood and affect  Imaging: DG Finger Thumb Right  Result Date:  07/04/2020 CLINICAL DATA:  Pain after twisting injury. EXAM: RIGHT THUMB 2+V COMPARISON:  None. FINDINGS: No acute fracture or dislocation. Mild osteoarthritis, with joint space narrowing and osteophyte formation. IMPRESSION: No acute osseous abnormality. Electronically Signed   By: Abigail Miyamoto M.D.   On: 07/04/2020 14:39      Allergies  Allergen Reactions  . Adhesive [Tape] Other (See Comments)    bandaids rip skin off (from [facility] space)    Past Medical History:  Diagnosis Date  . Allergy   . Anxiety   . Arthritis   . Hypertension   . Migraine    Social History   Socioeconomic History  . Marital status: Married    Spouse name: Not on file  . Number of children: 2  . Years of education: College  . Highest education level: Not on file  Occupational History  . Occupation: [employer]  . Smoking status: Former Research scientist (life sciences)  . Smokeless tobacco: Never Used  . Tobacco comment: Quit early 2000s  Substance and Sexual Activity  . Alcohol use: Yes    Comment: 1 every 5 months  . Drug use: No  . Sexual activity: Not on file  Other Topics Concern  . Not on file  Social History Narrative   Lives at home w/ her husband and son   Right-handed   Caffeine: 2 cups of coffee per day   Social Determinants of Health   Financial Resource Strain:   . Difficulty of Paying Living Expenses: Not on file  Food Insecurity:   . Worried About Charity fundraiser in the  Last Year: Not on file  . Ran Out of Food in the Last Year: Not on file  Transportation Needs:   . Lack of Transportation (Medical): Not on file  . Lack of Transportation (Non-Medical): Not on file  Physical Activity:   . Days of Exercise per Week: Not on file  . Minutes of Exercise per Session: Not on file  Stress:   . Feeling of Stress : Not on file  Social Connections:   . Frequency of Communication with Friends and Family: Not on file  . Frequency of Social Gatherings with Friends and Family: Not on file    . Attends Religious Services: Not on file  . Active Member of Clubs or Organizations: Not on file  . Attends Archivist Meetings: Not on file  . Marital Status: Not on file   Family History  Problem Relation Age of Onset  . Heart disease Father   . Heart defect Sister        Enlarged heart  . Stroke Sister   . Colon cancer Neg Hx    Past Surgical History:  Procedure Laterality Date  . NO PAST SURGERIES        Vanessa Kick, MD 07/04/20 1531

## 2020-07-04 NOTE — ED Triage Notes (Signed)
Pt presents with mild swelling and pain in the right thumb since 20:00 yesterday. States started having the pain after she tried to grab the her dog and he jumped and hit her right thumb. Pain worse with movement. Aleve gives no relief.

## 2020-07-04 NOTE — Discharge Instructions (Addendum)
If not allergic, you may use over the counter ibuprofen or acetaminophen as needed. ° °

## 2020-08-01 ENCOUNTER — Other Ambulatory Visit: Payer: Self-pay | Admitting: Obstetrics and Gynecology

## 2020-08-02 ENCOUNTER — Other Ambulatory Visit: Payer: Self-pay | Admitting: Obstetrics and Gynecology

## 2020-08-02 DIAGNOSIS — N6310 Unspecified lump in the right breast, unspecified quadrant: Secondary | ICD-10-CM

## 2020-09-11 ENCOUNTER — Other Ambulatory Visit: Payer: 59

## 2020-10-18 ENCOUNTER — Ambulatory Visit
Admission: RE | Admit: 2020-10-18 | Discharge: 2020-10-18 | Disposition: A | Discharge status: Home / Self Care | Payer: PRIVATE HEALTH INSURANCE | Source: Ambulatory Visit | Attending: Obstetrics and Gynecology | Admitting: Obstetrics and Gynecology

## 2020-10-18 ENCOUNTER — Other Ambulatory Visit: Payer: Self-pay

## 2020-10-18 ENCOUNTER — Other Ambulatory Visit: Payer: Self-pay | Admitting: Obstetrics and Gynecology

## 2020-10-18 DIAGNOSIS — N6310 Unspecified lump in the right breast, unspecified quadrant: Secondary | ICD-10-CM

## 2020-10-20 ENCOUNTER — Emergency Department (HOSPITAL_COMMUNITY)
Admission: EM | Admit: 2020-10-20 | Discharge: 2020-10-20 | Disposition: A | Discharge status: Home / Self Care | Payer: PRIVATE HEALTH INSURANCE | Attending: Emergency Medicine | Admitting: Emergency Medicine

## 2020-10-20 ENCOUNTER — Emergency Department (HOSPITAL_COMMUNITY): Payer: PRIVATE HEALTH INSURANCE

## 2020-10-20 ENCOUNTER — Encounter (HOSPITAL_COMMUNITY): Payer: Self-pay | Admitting: *Deleted

## 2020-10-20 ENCOUNTER — Other Ambulatory Visit: Payer: Self-pay

## 2020-10-20 ENCOUNTER — Encounter (HOSPITAL_COMMUNITY): Payer: Self-pay | Admitting: Emergency Medicine

## 2020-10-20 ENCOUNTER — Ambulatory Visit (HOSPITAL_COMMUNITY)
Admission: EM | Admit: 2020-10-20 | Discharge: 2020-10-20 | Disposition: A | Discharge status: Home / Self Care | Payer: PRIVATE HEALTH INSURANCE | Source: Home / Self Care

## 2020-10-20 DIAGNOSIS — I10 Essential (primary) hypertension: Secondary | ICD-10-CM | POA: Insufficient documentation

## 2020-10-20 DIAGNOSIS — N200 Calculus of kidney: Secondary | ICD-10-CM | POA: Insufficient documentation

## 2020-10-20 DIAGNOSIS — M549 Dorsalgia, unspecified: Secondary | ICD-10-CM | POA: Diagnosis not present

## 2020-10-20 DIAGNOSIS — Z5321 Procedure and treatment not carried out due to patient leaving prior to being seen by health care provider: Secondary | ICD-10-CM

## 2020-10-20 DIAGNOSIS — Z7982 Long term (current) use of aspirin: Secondary | ICD-10-CM | POA: Insufficient documentation

## 2020-10-20 DIAGNOSIS — R109 Unspecified abdominal pain: Secondary | ICD-10-CM | POA: Diagnosis present

## 2020-10-20 DIAGNOSIS — R112 Nausea with vomiting, unspecified: Secondary | ICD-10-CM | POA: Insufficient documentation

## 2020-10-20 DIAGNOSIS — Z79899 Other long term (current) drug therapy: Secondary | ICD-10-CM | POA: Insufficient documentation

## 2020-10-20 DIAGNOSIS — Z87891 Personal history of nicotine dependence: Secondary | ICD-10-CM | POA: Insufficient documentation

## 2020-10-20 LAB — BASIC METABOLIC PANEL
Anion gap: 13 (ref 5–15)
BUN: 17 mg/dL (ref 6–20)
CO2: 25 mmol/L (ref 22–32)
Calcium: 9.4 mg/dL (ref 8.9–10.3)
Chloride: 101 mmol/L (ref 98–111)
Creatinine, Ser: 1.33 mg/dL — ABNORMAL HIGH (ref 0.44–1.00)
GFR, Estimated: 46 mL/min — ABNORMAL LOW (ref >60)
Glucose, Bld: 113 mg/dL — ABNORMAL HIGH (ref 70–99)
Potassium: 3.8 mmol/L (ref 3.5–5.1)
Sodium: 139 mmol/L (ref 135–145)

## 2020-10-20 LAB — URINALYSIS, ROUTINE W REFLEX MICROSCOPIC
Bilirubin Urine: NEGATIVE
Glucose, UA: NEGATIVE mg/dL
Ketones, ur: 5 mg/dL — AB
Leukocytes,Ua: NEGATIVE
Nitrite: NEGATIVE
Protein, ur: NEGATIVE mg/dL
Specific Gravity, Urine: 1.02 (ref 1.005–1.030)
pH: 6 (ref 5.0–8.0)

## 2020-10-20 LAB — CBC
HCT: 39.3 % (ref 36.0–46.0)
Hemoglobin: 13.3 g/dL (ref 12.0–15.0)
MCH: 29.5 pg (ref 26.0–34.0)
MCHC: 33.8 g/dL (ref 30.0–36.0)
MCV: 87.1 fL (ref 80.0–100.0)
Platelets: 249 10*3/uL (ref 150–400)
RBC: 4.51 MIL/uL (ref 3.87–5.11)
RDW: 12.2 % (ref 11.5–15.5)
WBC: 12.4 10*3/uL — ABNORMAL HIGH (ref 4.0–10.5)
nRBC: 0 % (ref 0.0–0.2)

## 2020-10-20 LAB — POCT URINALYSIS DIPSTICK, ED / UC
Bilirubin Urine: NEGATIVE
Glucose, UA: NEGATIVE mg/dL
Leukocytes,Ua: NEGATIVE
Nitrite: NEGATIVE
Protein, ur: 30 mg/dL — AB
Specific Gravity, Urine: >=1.03 (ref 1.005–1.030)
Urobilinogen, UA: 0.2 mg/dL (ref 0.0–1.0)
pH: 5 (ref 5.0–8.0)

## 2020-10-20 MED ORDER — HYDROMORPHONE HCL 1 MG/ML IJ SOLN
1.0000 mg | Freq: Once | INTRAMUSCULAR | Status: AC
  Administered 2020-10-20: 1 mg via INTRAVENOUS
  Filled 2020-10-20: qty 1

## 2020-10-20 MED ORDER — ONDANSETRON HCL 4 MG/2ML IJ SOLN
4.0000 mg | Freq: Once | INTRAMUSCULAR | Status: AC
  Administered 2020-10-20: 4 mg via INTRAVENOUS
  Filled 2020-10-20: qty 2

## 2020-10-20 MED ORDER — SODIUM CHLORIDE 0.9 % IV BOLUS
1000.0000 mL | Freq: Once | INTRAVENOUS | Status: AC
  Administered 2020-10-20: 1000 mL via INTRAVENOUS

## 2020-10-20 MED ORDER — KETOROLAC TROMETHAMINE 30 MG/ML IJ SOLN
15.0000 mg | Freq: Once | INTRAMUSCULAR | Status: AC
  Administered 2020-10-20: 15 mg via INTRAVENOUS
  Filled 2020-10-20: qty 1

## 2020-10-20 MED ORDER — ONDANSETRON 4 MG PO TBDP
4.0000 mg | ORAL_TABLET | Freq: Once | ORAL | Status: AC | PRN
  Administered 2020-10-20: 4 mg via ORAL
  Filled 2020-10-20 (×2): qty 1

## 2020-10-20 MED ORDER — HYDROCODONE-ACETAMINOPHEN 5-325 MG PO TABS: 1.0000 | ORAL_TABLET | Freq: Four times a day (QID) | ORAL | 0 refills | Status: DC | PRN

## 2020-10-20 MED ORDER — ONDANSETRON 4 MG PO TBDP: 4.0000 mg | ORAL_TABLET | Freq: Three times a day (TID) | ORAL | 0 refills | Status: DC | PRN

## 2020-10-20 NOTE — Discharge Instructions (Signed)
As discussed, you have been diagnosed with kidney stones.  It is important that you stay well-hydrated and monitor your condition carefully.  Return here for concerning changes in your condition.

## 2020-10-20 NOTE — ED Notes (Signed)
Patient came to nursing desk and informed this RN that she would like to leave and go to local emergency room due to severe back pain.

## 2020-10-20 NOTE — ED Triage Notes (Signed)
Patient here from home reporting right side flank pain n/v that started 2-3 days ago.

## 2020-10-20 NOTE — ED Triage Notes (Signed)
Pt reports Rt flank pain for 2 days ago.

## 2020-10-20 NOTE — ED Provider Notes (Signed)
[facility] DEPT Provider Note   CSN: [id number] Arrival date & time: 10/20/20  1225     History Chief Complaint  Patient presents with  . Flank Pain  . Nausea  . Emesis    Brooke Harrison is a 60 y.o. female.  HPI Patient presents with new right flank and back pain.  Onset was within the past 24 hours.  Since onset pain has been waxing and waning, but is now severe sustained focal.  No relief with OTC medication.  On there is associated nausea, vomiting. No true dysuria, no hematuria, no polyuria. She has no history of kidney stones, nor pyelonephritis. She states that she is generally well, was so prior to the onset of this illness.    Past Medical History:  Diagnosis Date  . Allergy   . Anxiety   . Arthritis   . Hypertension   . Migraine     Patient Active Problem List   Diagnosis Date Noted  . Unspecified essential hypertension 04/28/2013    Past Surgical History:  Procedure Laterality Date  . NO PAST SURGERIES       OB History   No obstetric history on file.     Family History  Problem Relation Age of Onset  . Heart disease Father   . Heart defect Sister        Enlarged heart  . Stroke Sister   . Colon cancer Neg Hx     Social History   Tobacco Use  . Smoking status: Former Research scientist (life sciences)  . Smokeless tobacco: Never Used  . Tobacco comment: Quit early 2000s  Substance Use Topics  . Alcohol use: Yes    Comment: 1 every 5 months  . Drug use: No    Home Medications Prior to Admission medications   Medication Sig Start Date End Date Taking? Authorizing Provider  amLODipine (NORVASC) 5 MG tablet Take 5 mg by mouth daily.    [provider]  Aspirin-Acetaminophen-Caffeine (GOODY HEADACHE PO) Take by mouth as needed.    [provider]  benzonatate (TESSALON) 100 MG capsule Take 1 capsule (100 mg total) by mouth every 8 (eight) hours. 11/21/19   Loura Halt A, NP  FLUoxetine (PROZAC) 40 MG capsule Take  40 mg by mouth daily.    [provider]  guaiFENesin (ROBITUSSIN) 100 MG/5ML liquid Take 5-10 mLs (100-200 mg total) by mouth every 4 (four) hours as needed for cough. 11/21/19   Loura Halt A, NP  ondansetron (ZOFRAN ODT) 4 MG disintegrating tablet Take 1 tablet (4 mg total) by mouth every 8 (eight) hours as needed for nausea or vomiting. 11/21/19   Loura Halt A, NP  SUMAtriptan (IMITREX) 100 MG tablet Take 1 tablet (100 mg total) by mouth once as needed. May repeat in 2 hours if headache persists or recurs. 12/16/16   Melvenia Beam, MD  topiramate (TOPAMAX) 25 MG tablet Take 2 tablets (50 mg total) by mouth at bedtime. 12/16/16   Melvenia Beam, MD    Allergies    Adhesive [tape]  Review of Systems   Review of Systems  Constitutional:       Per HPI, otherwise negative  HENT:       Per HPI, otherwise negative  Respiratory:       Per HPI, otherwise negative  Cardiovascular:       Per HPI, otherwise negative  Gastrointestinal: Negative for vomiting.  Endocrine:       Negative aside from HPI  Genitourinary:       Neg aside from HPI   Musculoskeletal:       Per HPI, otherwise negative  Skin: Negative.   Neurological: Negative for syncope.    Physical Exam Updated Vital Signs BP (!) 161/96   Pulse 77   Temp 98.2 F (36.8 C) (Oral)   Resp 19   SpO2 98%   Physical Exam Vitals and nursing note reviewed.  Constitutional:      Appearance: She is well-developed and well-nourished. She is ill-appearing.     Comments: Uncomfortable appearing adult female awake and alert  HENT:     Head: Normocephalic and atraumatic.  Eyes:     Extraocular Movements: EOM normal.     Conjunctiva/sclera: Conjunctivae normal.  Cardiovascular:     Rate and Rhythm: Normal rate and regular rhythm.  Pulmonary:     Effort: Pulmonary effort is normal. No respiratory distress.     Breath sounds: Normal breath sounds. No stridor.  Abdominal:     General: There is no distension.      Tenderness: There is no abdominal tenderness. There is no guarding.  Musculoskeletal:        General: No edema.  Skin:    General: Skin is warm and dry.  Neurological:     Mental Status: She is alert and oriented to person, place, and time.     Cranial Nerves: No cranial nerve deficit.  Psychiatric:        Mood and Affect: Mood and affect normal.     ED Results / Procedures / Treatments   Labs (all labs ordered are listed, but only abnormal results are displayed) Labs Reviewed  URINALYSIS, ROUTINE W REFLEX MICROSCOPIC - Abnormal; Notable for the following components:      Result Value   Hgb urine dipstick SMALL (*)    Ketones, ur 5 (*)    Bacteria, UA RARE (*)    All other components within normal limits  BASIC METABOLIC PANEL - Abnormal; Notable for the following components:   Glucose, Bld 113 (*)    Creatinine, Ser 1.33 (*)    GFR, Estimated 46 (*)    All other components within normal limits  CBC - Abnormal; Notable for the following components:   WBC 12.4 (*)    All other components within normal limits  URINE CULTURE    EKG None  Radiology CT Renal Stone Study  Result Date: 10/20/2020 CLINICAL DATA:  Pain EXAM: CT ABDOMEN AND PELVIS WITHOUT CONTRAST TECHNIQUE: Multidetector CT imaging of the abdomen and pelvis was performed following the standard protocol without IV contrast. COMPARISON:  October 08, 2026 FINDINGS: Lower chest: No acute abnormality. Hepatobiliary: Focal fatty deposition adjacent to the falciform ligament. Gallbladder is unremarkable. No extrahepatic biliary ductal dilation. Pancreas: No peripancreatic fat stranding. Spleen: Unremarkable Adrenals/Urinary Tract: There is a 4 mm ureterolithiasis at the RIGHT UVJ with mild upstream hydroureteronephrosis. There is fat stranding adjacent to the course of the RIGHT renal collecting system. No LEFT-sided hydronephrosis. Partially duplicated LEFT renal collecting system. Bladder is unremarkable. Adrenal glands  are unremarkable. Stomach/Bowel: Scattered diverticulosis. No evidence of bowel obstruction. Appendix is normal. Tiny hiatal hernia. Vascular/Lymphatic: No significant vascular findings are present. No enlarged abdominal or pelvic lymph nodes. Reproductive: Uterus and bilateral adnexa are unremarkable. Other: No free air.  No abdominal wall hernia. Musculoskeletal: No acute or significant osseous findings. Degenerative changes of L1-2. IMPRESSION: There is a 4 mm ureterolithiasis at the RIGHT UVJ with mild upstream hydroureteronephrosis. There is  adjacent fat stranding. Recommend correlation with urinalysis to exclude superimposed infection. Electronically Signed   By: Valentino Saxon MD   On: 10/20/2020 17:41    Procedures Procedures   Medications Ordered in ED Medications  ondansetron (ZOFRAN-ODT) disintegrating tablet 4 mg (has no administration in time range)  sodium chloride 0.9 % bolus 1,000 mL (has no administration in time range)  HYDROmorphone (DILAUDID) injection 1 mg (has no administration in time range)  ketorolac (TORADOL) 30 MG/ML injection 15 mg (has no administration in time range)    ED Course  I have reviewed the triage vital signs and the nursing notes.  Pertinent labs & imaging results that were available during my care of the patient were reviewed by me and considered in my medical decision making (see chart for details).   On repeat exam the patient is feeling substantially better. Initial labs, CT reviewed, patient is aware of kidney stone.   11:26 PM Patient has had an additional episode of nausea with vomiting.  She is finally produced a urine sample, this is not consistent with infection. Patient remains hemodynamically unremarkable. Given her improvement here, with no evidence evidence for obstruction or infection of her kidney stone she is appropriate for discharge with outpatient follow-up. Final Clinical Impression(s) / ED Diagnoses Final diagnoses:   Kidney stone    Rx / DC Orders ED Discharge Orders         Ordered    HYDROcodone-acetaminophen (NORCO/VICODIN) 5-325 MG tablet  Every 6 hours PRN        10/20/20 2324    ondansetron (ZOFRAN ODT) 4 MG disintegrating tablet  Every 8 hours PRN        10/20/20 2324           Carmin Muskrat, MD 10/20/20 2327

## 2020-10-21 LAB — URINE CULTURE: Culture: NO GROWTH

## 2020-10-22 LAB — URINE CULTURE

## 2020-10-24 ENCOUNTER — Ambulatory Visit
Admission: RE | Admit: 2020-10-24 | Discharge: 2020-10-24 | Disposition: A | Discharge status: Home / Self Care | Payer: PRIVATE HEALTH INSURANCE | Source: Ambulatory Visit | Attending: Obstetrics and Gynecology | Admitting: Obstetrics and Gynecology

## 2020-10-24 DIAGNOSIS — N6310 Unspecified lump in the right breast, unspecified quadrant: Secondary | ICD-10-CM

## 2020-11-25 ENCOUNTER — Other Ambulatory Visit: Payer: Self-pay | Admitting: Surgery

## 2020-11-25 DIAGNOSIS — D241 Benign neoplasm of right breast: Secondary | ICD-10-CM

## 2022-01-14 ENCOUNTER — Other Ambulatory Visit: Payer: Self-pay | Admitting: Obstetrics and Gynecology

## 2022-01-14 DIAGNOSIS — N6310 Unspecified lump in the right breast, unspecified quadrant: Secondary | ICD-10-CM

## 2022-01-29 ENCOUNTER — Ambulatory Visit
Admission: RE | Admit: 2022-01-29 | Discharge: 2022-01-29 | Disposition: A | Discharge status: Home / Self Care | Payer: No Typology Code available for payment source | Source: Ambulatory Visit | Attending: Obstetrics and Gynecology | Admitting: Obstetrics and Gynecology

## 2022-01-29 ENCOUNTER — Ambulatory Visit
Admission: RE | Admit: 2022-01-29 | Discharge: 2022-01-29 | Disposition: A | Discharge status: Home / Self Care | Payer: PRIVATE HEALTH INSURANCE | Source: Ambulatory Visit | Attending: Obstetrics and Gynecology | Admitting: Obstetrics and Gynecology

## 2022-01-29 DIAGNOSIS — N6310 Unspecified lump in the right breast, unspecified quadrant: Secondary | ICD-10-CM

## 2022-02-01 ENCOUNTER — Ambulatory Visit (INDEPENDENT_AMBULATORY_CARE_PROVIDER_SITE_OTHER): Payer: No Typology Code available for payment source

## 2022-02-01 ENCOUNTER — Other Ambulatory Visit (HOSPITAL_COMMUNITY): Payer: No Typology Code available for payment source

## 2022-02-01 ENCOUNTER — Ambulatory Visit (HOSPITAL_COMMUNITY)
Admission: EM | Admit: 2022-02-01 | Discharge: 2022-02-01 | Disposition: A | Discharge status: Home / Self Care | Payer: No Typology Code available for payment source | Attending: Emergency Medicine | Admitting: Emergency Medicine

## 2022-02-01 DIAGNOSIS — S92354A Nondisplaced fracture of fifth metatarsal bone, right foot, initial encounter for closed fracture: Secondary | ICD-10-CM

## 2022-02-01 NOTE — Discharge Instructions (Addendum)
Your x-ray today showed a fracture ( break in bone) of the base of your fifth toe  May continue use of Tylenol or ibuprofen every 6 hours as needed for general comfort  You have been placed in a boot.  tHis is used to protect your injury and prevent further damage. Leave in place until seen by orthopedic specialist.  May remove when sleeping and for bathing  Follow up with orthopedic specialist in 1 week. Call practice to make appointment. Information listed below. You may go to any orthopedic provider you deem fit  May use ice or heat over the affected area 10 to 15-minute intervals

## 2022-02-01 NOTE — ED Provider Notes (Signed)
[place]    CSN: [id number] Arrival date & time: 02/01/22  1705      History   Chief Complaint Chief Complaint  Patient presents with   Fall    HPI Brooke Harrison is a 61 y.o. female.   Patient presents with right foot pain and swelling starting at the pinky toe radiating down the lateral aspect into the midfoot beginning today.  Endorses that she was walking down steps when she fell, heard a cracking sound upon landing.  Currently rating pain a 5 out of 10.  Painful to bear weight.  Has attempted use of ibuprofen which has been somewhat helpful.  Denies numbness or tingling.    Past Medical History:  Diagnosis Date   Allergy    Anxiety    Arthritis    Hypertension    Migraine     Patient Active Problem List   Diagnosis Date Noted   Unspecified essential hypertension 04/28/2013    Past Surgical History:  Procedure Laterality Date   NO PAST SURGERIES      OB History   No obstetric history on file.      Home Medications    Prior to Admission medications   Medication Sig Start Date End Date Taking? Authorizing Provider  amLODipine (NORVASC) 5 MG tablet Take 5 mg by mouth daily. Patient not taking: Reported on 02/01/2022    [provider]  Aspirin-Acetaminophen-Caffeine (GOODY HEADACHE PO) Take 1 Package by mouth daily as needed (headache).    [provider]  benzonatate (TESSALON) 100 MG capsule Take 1 capsule (100 mg total) by mouth every 8 (eight) hours. Patient not taking: No sig reported 11/21/19   Loura Halt A, NP  guaiFENesin (ROBITUSSIN) 100 MG/5ML liquid Take 5-10 mLs (100-200 mg total) by mouth every 4 (four) hours as needed for cough. Patient not taking: No sig reported 11/21/19   Loura Halt A, NP  HYDROcodone-acetaminophen (NORCO/VICODIN) 5-325 MG tablet Take 1 tablet by mouth every 6 (six) hours as needed for severe pain. 10/20/20   Carmin Muskrat, MD  Hyprom-Naphaz-Polysorb-Zn Sulf (CLEAR EYES COMPLETE OP) Place 1  drop into both eyes daily as needed (dry eyes).    [provider]  ondansetron (ZOFRAN ODT) 4 MG disintegrating tablet Take 1 tablet (4 mg total) by mouth every 8 (eight) hours as needed for nausea or vomiting. Patient not taking: Reported on 02/01/2022 10/20/20   Carmin Muskrat, MD  SUMAtriptan (IMITREX) 100 MG tablet Take 1 tablet (100 mg total) by mouth once as needed. May repeat in 2 hours if headache persists or recurs. Patient not taking: No sig reported 12/16/16   Melvenia Beam, MD  topiramate (TOPAMAX) 25 MG tablet Take 2 tablets (50 mg total) by mouth at bedtime. Patient not taking: No sig reported 12/16/16   Melvenia Beam, MD    Family History Family History  Problem Relation Age of Onset   Heart disease Father    Heart defect Sister        Enlarged heart   Stroke Sister    Colon cancer Neg Hx     Social History Social History   Tobacco Use   Smoking status: Former   Smokeless tobacco: Never   Tobacco comments:    Quit early 2000s  Substance Use Topics   Alcohol use: Yes    Comment: 1 every 5 months   Drug use: No     Allergies   Adhesive [tape]   Review of Systems Review  of Systems  Constitutional: Negative.   Respiratory: Negative.    Cardiovascular: Negative.   Musculoskeletal:  Positive for gait problem and myalgias. Negative for arthralgias, back pain, joint swelling, neck pain and neck stiffness.  Skin: Negative.      Physical Exam Triage Vital Signs ED Triage Vitals  Enc Vitals Group     BP 02/01/22 1744 (!) 154/95     Pulse Rate 02/01/22 1744 76     Resp 02/01/22 1744 18     Temp 02/01/22 1744 98.5 F (36.9 C)     Temp src --      SpO2 02/01/22 1744 96 %     Weight --      Height --      Head Circumference --      Peak Flow --      Pain Score 02/01/22 1743 5     Pain Loc --      Pain Edu? --      Excl. in [place]? --    No data found.  Updated Vital Signs BP (!) 154/95 (BP Location: Right Arm)   Pulse 76   Temp 98.5  F (36.9 C)   Resp 18   SpO2 96%   Visual Acuity Right Eye Distance:   Left Eye Distance:   Bilateral Distance:    Right Eye Near:   Left Eye Near:    Bilateral Near:     Physical Exam Constitutional:      Appearance: Normal appearance.  Eyes:     Extraocular Movements: Extraocular movements intact.  Pulmonary:     Effort: Pulmonary effort is normal.  Feet:     Comments: Mild to moderate swelling and tenderness begins at the base of the fifth metatarsal extending down the lateral aspect of the midfoot, no ecchymosis deformity noted, sensation intact, 2+ pedal pulse, unable to bear weight Neurological:     Mental Status: She is alert and oriented to person, place, and time. Mental status is at baseline.  Psychiatric:        Mood and Affect: Mood normal.        Behavior: Behavior normal.      UC Treatments / Results  Labs (all labs ordered are listed, but only abnormal results are displayed) Labs Reviewed - No data to display  EKG   Radiology DG Foot Complete Right  Result Date: 02/01/2022 CLINICAL DATA:  Injury. EXAM: RIGHT FOOT COMPLETE - 3+ VIEW COMPARISON:  None. FINDINGS: Lung oblique fracture noted in the diaphysis of the fifth metatarsal. Approximately 1/2 shaft with medial displacement of the distal fragment relative to the proximal. No extension to an articular surface. No other acute bony abnormality evident. IMPRESSION: Oblique fracture of the fifth metatarsal diaphysis. Electronically Signed   By: Misty Stanley M.D.   On: 02/01/2022 17:34    Procedures Procedures (including critical care time)  Medications Ordered in UC Medications - No data to display  Initial Impression / Assessment and Plan / UC Course  I have reviewed the triage vital signs and the nursing notes.  Pertinent labs & imaging results that were available during my care of the patient were reviewed by me and considered in my medical decision making (see chart for details).  Nondisplaced  fracture of fifth metatarsal bone, right foot, initial encounter  Confirmed by x-ray, discussed findings with patient, placed in cam boot by nursing staff, patient to wear at all times with the exception of bathing and sleep, advised follow-up with orthopedics in  1 week for further evaluation and management, given walking referral, may continue use of ibuprofen as well as Tylenol for management of discomfort, may attempt use of ice over the area as well Final Clinical Impressions(s) / UC Diagnoses   Final diagnoses:  Nondisplaced fracture of fifth metatarsal bone, right foot, initial encounter for closed fracture     Discharge Instructions      Your x-ray today showed a fracture ( break in bone) of the base of your fifth toe   You have been placed in a boot.  tHis is used to protect your injury and prevent further damage. Leave in place until seen by orthopedic specialist.  May remove when sleeping and for bathing  Follow up with orthopedic specialist in 1 week. Call practice to make appointment. Information listed below. You may go to any orthopedic provider you deem fit.  Please do not put weight on fracture.   May use ice or heat over the affected area 10 to 15-minute intervals     ED Prescriptions   None    PDMP not reviewed this encounter.   Hans Eden, NP 02/01/22 1806

## 2022-02-01 NOTE — ED Triage Notes (Signed)
Patient presents to Urgent Care with complaints of R foot pain since a few hours ago when she missed a step and fell down and heard a crack in her foot right side of foot has pain radiating down the side. Patient reports pain is 5/10. Pt reports '600mg'$  motrin

## 2022-04-21 ENCOUNTER — Ambulatory Visit: Payer: No Typology Code available for payment source | Admitting: Podiatry

## 2022-04-21 ENCOUNTER — Encounter: Payer: Self-pay | Admitting: Podiatry

## 2022-04-21 DIAGNOSIS — L603 Nail dystrophy: Secondary | ICD-10-CM

## 2022-04-21 DIAGNOSIS — L600 Ingrowing nail: Secondary | ICD-10-CM | POA: Diagnosis not present

## 2022-04-21 MED ORDER — NEOMYCIN-POLYMYXIN-HC 1 % OT SOLN: OTIC | 1 refills | Status: AC

## 2022-04-21 NOTE — Patient Instructions (Signed)

## 2022-04-22 NOTE — Progress Notes (Signed)
  Subjective:  Patient ID: Annie Sable, female    DOB: 11-29-1960,  MRN: [id number] HPI Chief Complaint  Patient presents with   Toe Pain    Hallux bilateral - (L>R) - lateral border left very tender, toenails thick and discolored x years, tried lotrimin   New Patient (Initial Visit)    61 y.o. female presents with the above complaint.   ROS: Denies fever chills nausea vomiting muscle aches pains calf pain back pain chest pain shortness of breath.  Past Medical History:  Diagnosis Date   Allergy    Anxiety    Arthritis    Hypertension    Migraine    Past Surgical History:  Procedure Laterality Date   NO PAST SURGERIES      Current Outpatient Medications:    NEOMYCIN-POLYMYXIN-HYDROCORTISONE (CORTISPORIN) 1 % SOLN OTIC solution, Apply 1-2 drops to toe BID after soaking, Disp: 10 mL, Rfl: 1   Aspirin-Acetaminophen-Caffeine (GOODY HEADACHE PO), Take 1 Package by mouth daily as needed (headache)., Disp: , Rfl:    Hyprom-Naphaz-Polysorb-Zn Sulf (CLEAR EYES COMPLETE OP), Place 1 drop into both eyes daily as needed (dry eyes)., Disp: , Rfl:   Allergies  Allergen Reactions   Adhesive [Tape] Other (See Comments)    bandaids rip skin off (from [facility] space)   Review of Systems Objective:  There were no vitals filed for this visit.  General: Well developed, nourished, in no acute distress, alert and oriented x3   Dermatological: Skin is warm, dry and supple bilateral. Nails x 10 are well maintained; remaining integument appears unremarkable at this time. There are no open sores, no preulcerative lesions, no rash or signs of infection present.  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  Vascular: Dorsalis Pedis artery and Posterior Tibial artery pedal pulses are 2/4 bilateral with immedate capillary fill time. Pedal hair growth present. No varicosities and no lower extremity edema present  bilateral.   Neruologic: Grossly intact via light touch bilateral. Vibratory intact via tuning fork bilateral. Protective threshold with Semmes Wienstein monofilament intact to all pedal sites bilateral. Patellar and Achilles deep tendon reflexes 2+ bilateral. No Babinski or clonus noted bilateral.   Musculoskeletal: No gross boney pedal deformities bilateral. No pain, crepitus, or limitation noted with foot and ankle range of motion bilateral. Muscular strength 5/5 in all groups tested bilateral.  Gait: Unassisted, Nonantalgic.    Radiographs:  None taken  Assessment & Plan:   Assessment: Nail dystrophy hallux bilateral also ingrown toenail hallux.  Plan: Samples of the skin and nail were taken today for pathologic evaluation also performed a partial chemical matricectomy to the hallux.  Tolerated procedure well without complications.  Given both oral and home-going structure for the care and soaking of the toes as well as prescription Cortisporin Otic to be applied twice daily after soaking.  Follow-up with her in 2 weeks.     Kaylina Cahue T. [place], [place]

## 2022-05-07 ENCOUNTER — Ambulatory Visit: Payer: No Typology Code available for payment source | Admitting: Podiatrist

## 2022-05-07 ENCOUNTER — Ambulatory Visit: Payer: No Typology Code available for payment source | Admitting: Podiatry

## 2022-05-21 ENCOUNTER — Ambulatory Visit: Payer: No Typology Code available for payment source | Admitting: Podiatry

## 2022-06-04 ENCOUNTER — Encounter: Payer: Self-pay | Admitting: Podiatry

## 2022-06-04 ENCOUNTER — Ambulatory Visit: Payer: No Typology Code available for payment source | Admitting: Podiatry

## 2022-06-04 DIAGNOSIS — L600 Ingrowing nail: Secondary | ICD-10-CM

## 2022-06-04 NOTE — Patient Instructions (Signed)

## 2022-06-07 NOTE — Progress Notes (Signed)
Subjective:   Patient ID: Brooke Harrison, female   DOB: 61 y.o.   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  I infiltrated the right hallux 60 mg Xylocaine Marcaine mixture sterile prep done using sterile instrumentation removed border exposed matrix applied phenol 3 applications 30 seconds followed by alcohol lavage sterile dressing instructed on soaks and to leave dressing on 24 hours take it off earlier if throbbing were to occur and come in with any questions concerns which may arise

## 2022-07-07 ENCOUNTER — Ambulatory Visit: Payer: No Typology Code available for payment source | Admitting: Podiatry

## 2022-07-29 IMAGING — MG DIGITAL DIAGNOSTIC BILAT W/ TOMO W/ CAD
8 series · 8 of 24 positions shown · non-contrast
Comparison: Previous exam(s).

CLINICAL DATA: 60-year-old female for 1 year follow-up of
biopsy-proven RIGHT breast papilloma and also for annual bilateral
mammogram.

EXAM:
DIGITAL DIAGNOSTIC BILATERAL MAMMOGRAM WITH TOMOSYNTHESIS AND CAD;
ULTRASOUND RIGHT BREAST LIMITED
TECHNIQUE: Bilateral digital diagnostic mammography and breast tomosynthesis
was performed. The images were evaluated with computer-aided
detection.; Targeted ultrasound examination of the right breast was
performed

[L MLO synth-2D]
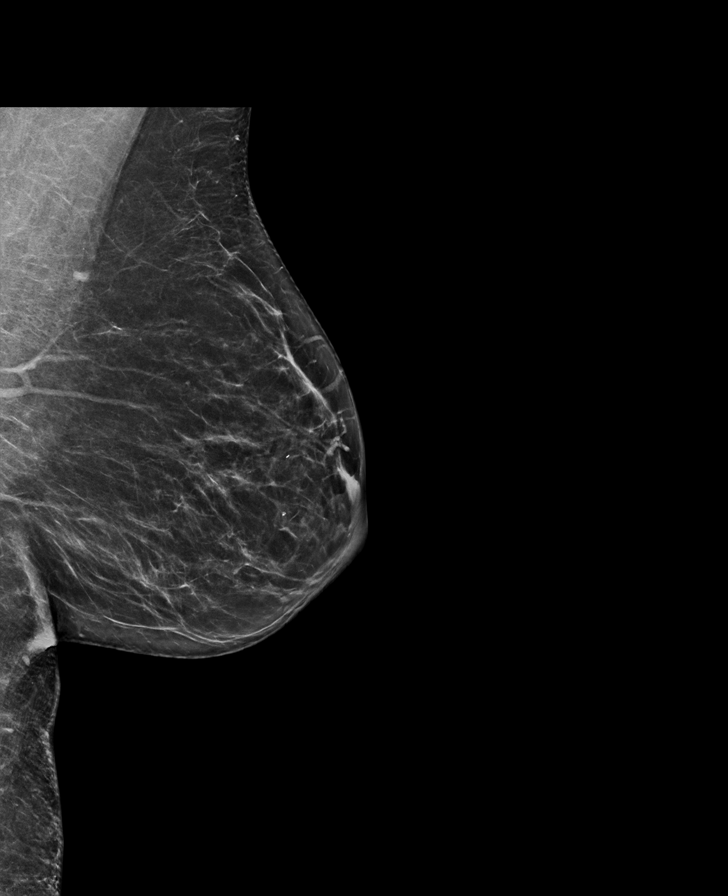

[R CC synth-2D]
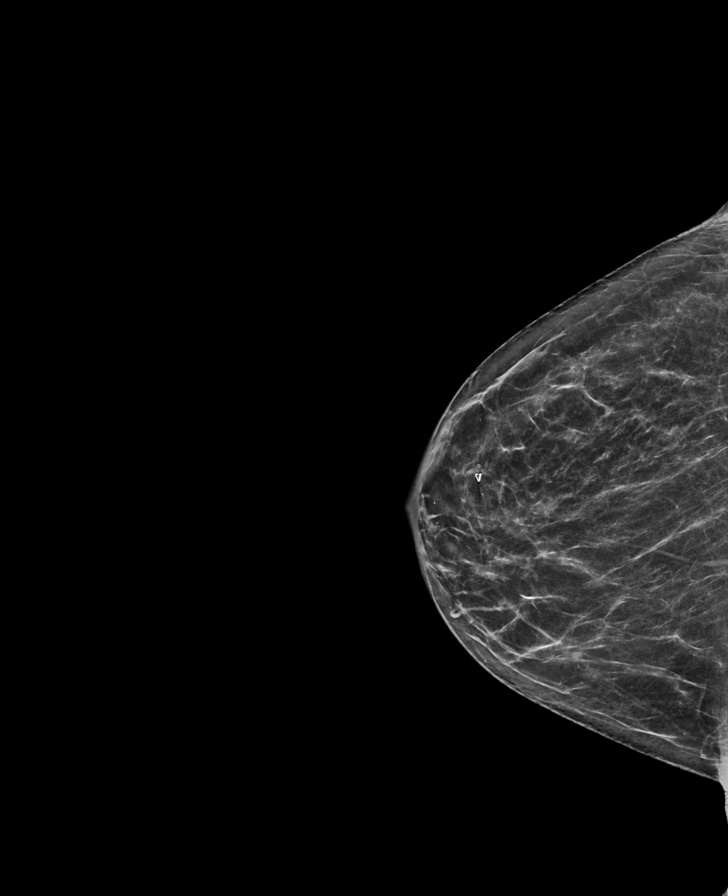

[L CC synth-2D]
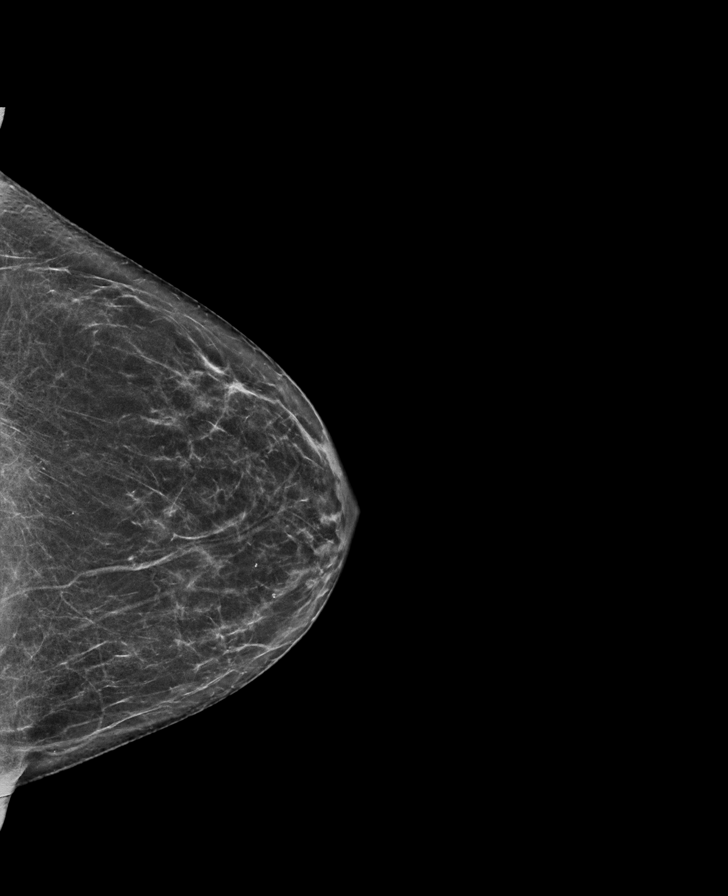

[R MLO synth-2D]
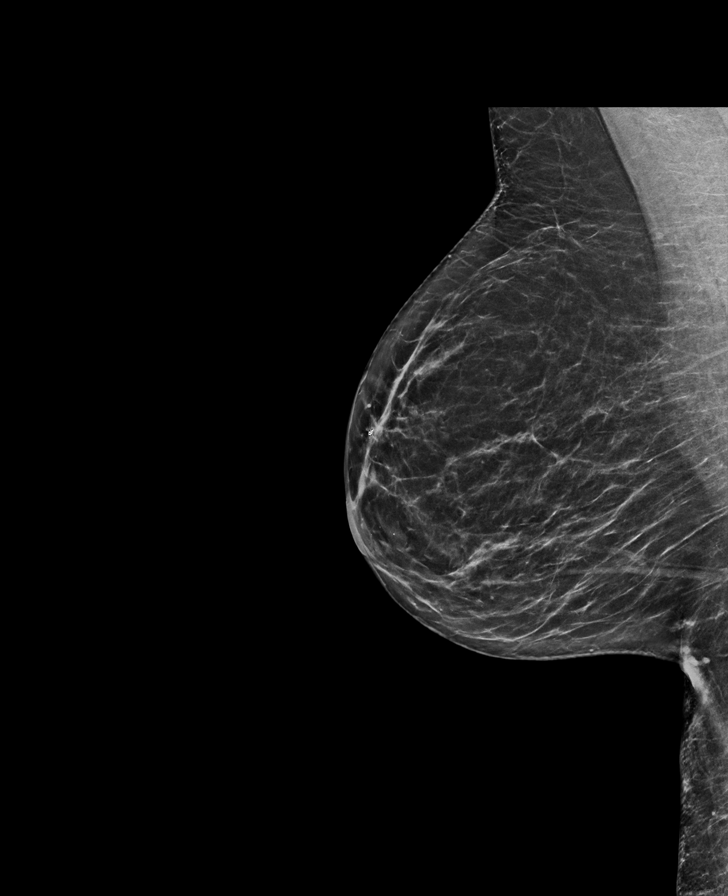

[L MLO tomo · tomo slice 33/66.0]
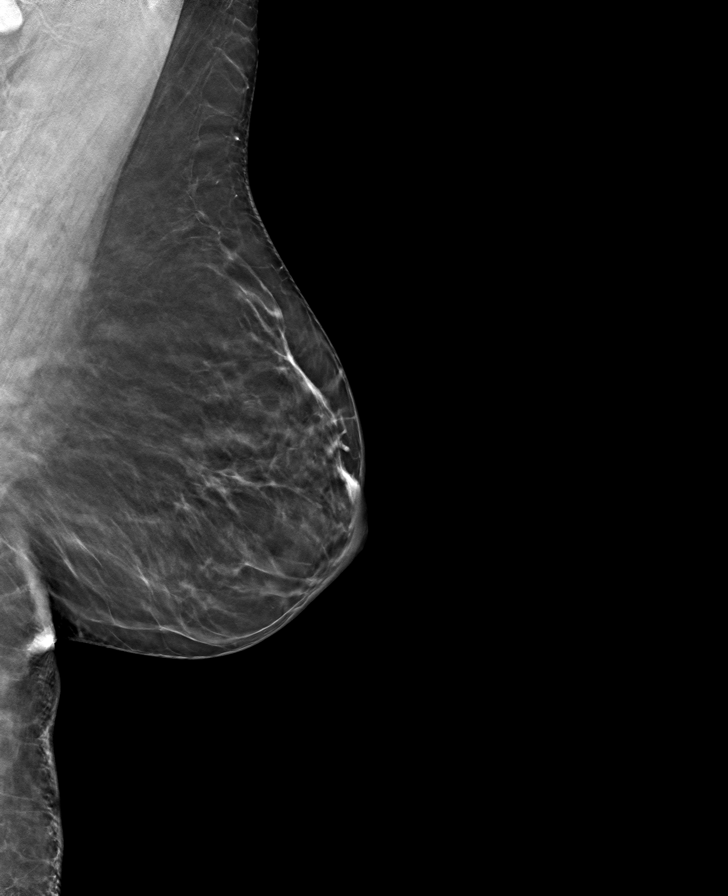

[L CC tomo · tomo slice 31/61.0]
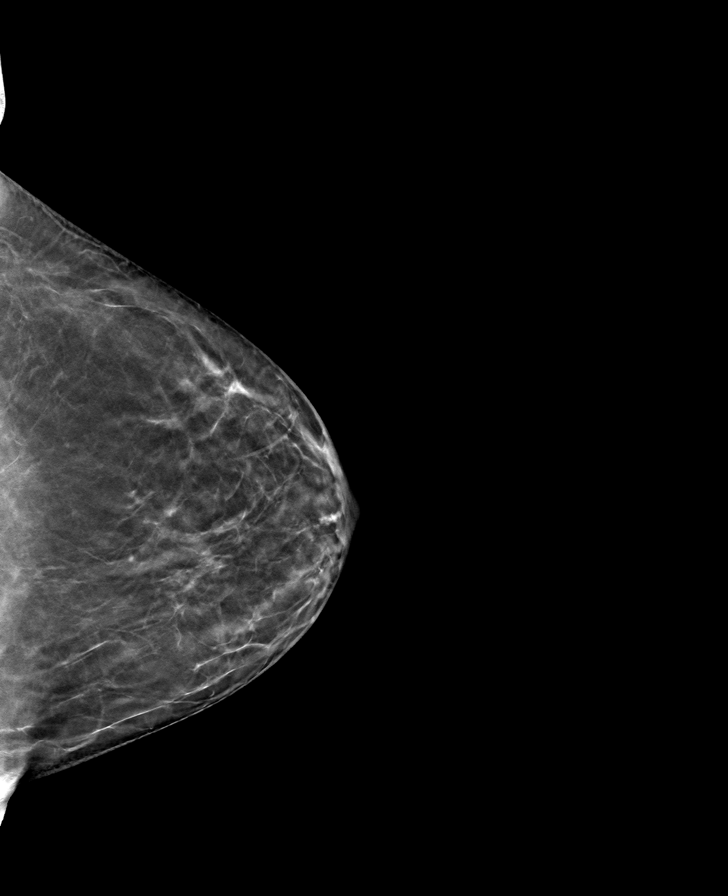

[R CC tomo · tomo slice 31/62.0]
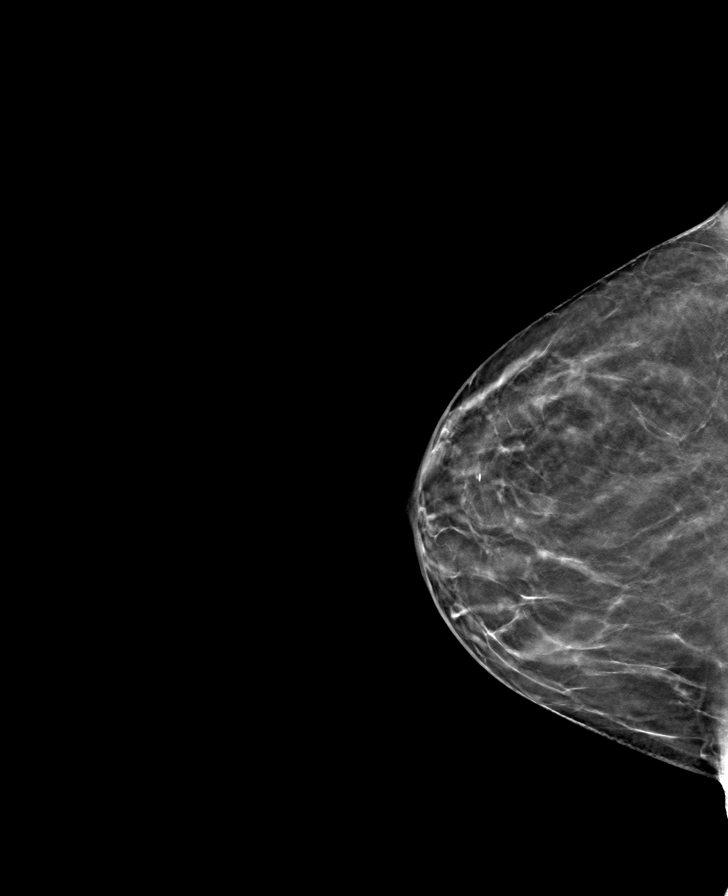

[R MLO tomo · tomo slice 33/65.0]
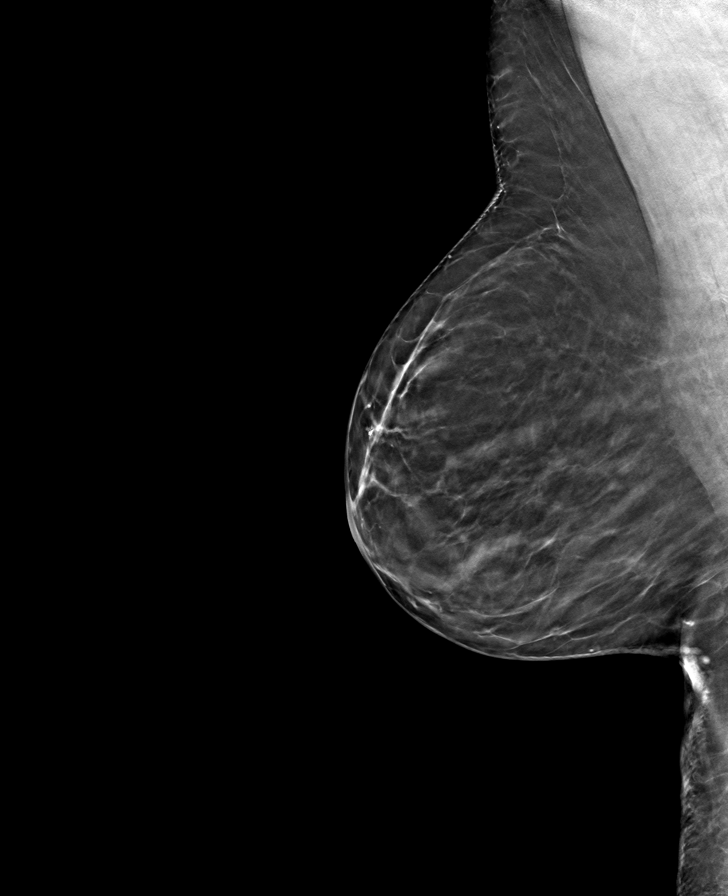

[8 of 24 positions shown; findings below may reference images not displayed]

ACR Breast Density Category b: There are scattered areas of
fibroglandular density.
FINDINGS: Full field views of both breasts demonstrate a heart shaped biopsy
clip within the anterior UPPER RIGHT breast at the site of
biopsy-proven papilloma with papilloma/mass now difficult to
visualized mammographically.

No new or suspicious findings within either breast noted.

Targeted ultrasound is performed. The biopsy-proven papilloma at the
12 o'clock position of the RIGHT breast 1 cm from the nipple is not
visualized sonographically. Biopsy clip artifact at this site is
noted.
IMPRESSION: 1. No persistent mass at the site of biopsy-proven UPPER RIGHT
breast papilloma. One additional follow-up in 1 year is recommended
to ensure 2 year stability.
2. No new or suspicious mammographic findings within either breast.

RECOMMENDATION:
Bilateral diagnostic mammogram and RIGHT breast ultrasound in 1
year.

I have discussed the findings and recommendations with the patient.
If applicable, a reminder letter will be sent to the patient
regarding the next appointment.

BI-RADS CATEGORY  2: Benign.

## 2022-08-01 IMAGING — DX DG FOOT COMPLETE 3+V*R*
3 series · 3 of 3 positions shown · non-contrast
Comparison: None.

CLINICAL DATA: Injury.

EXAM:
RIGHT FOOT COMPLETE - 3+ VIEW

[foot ap]
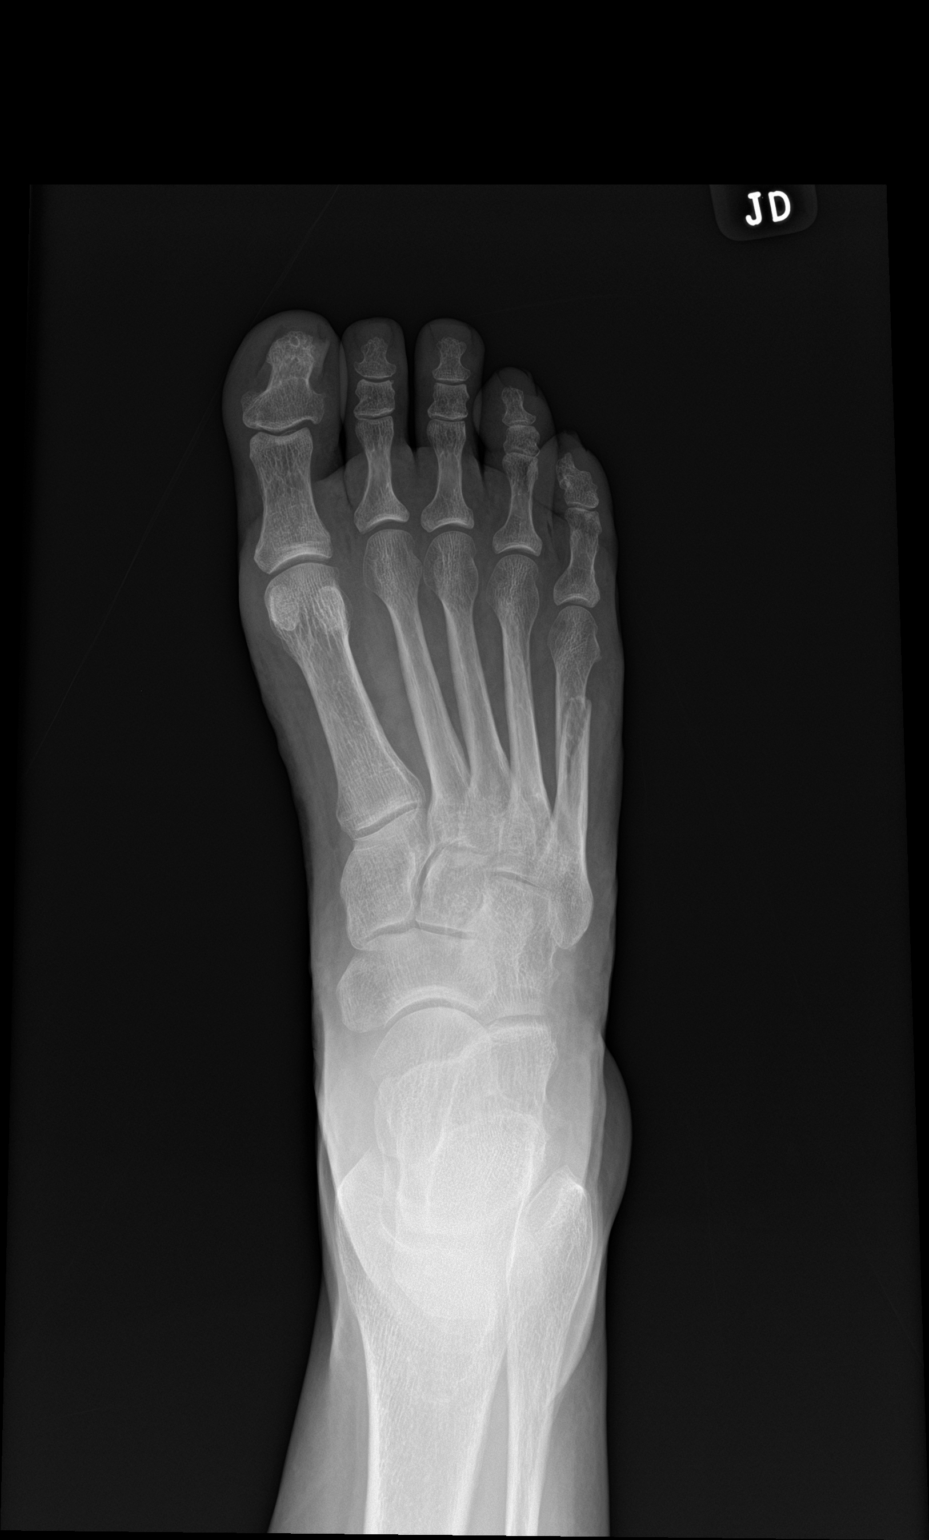

[foot obl]
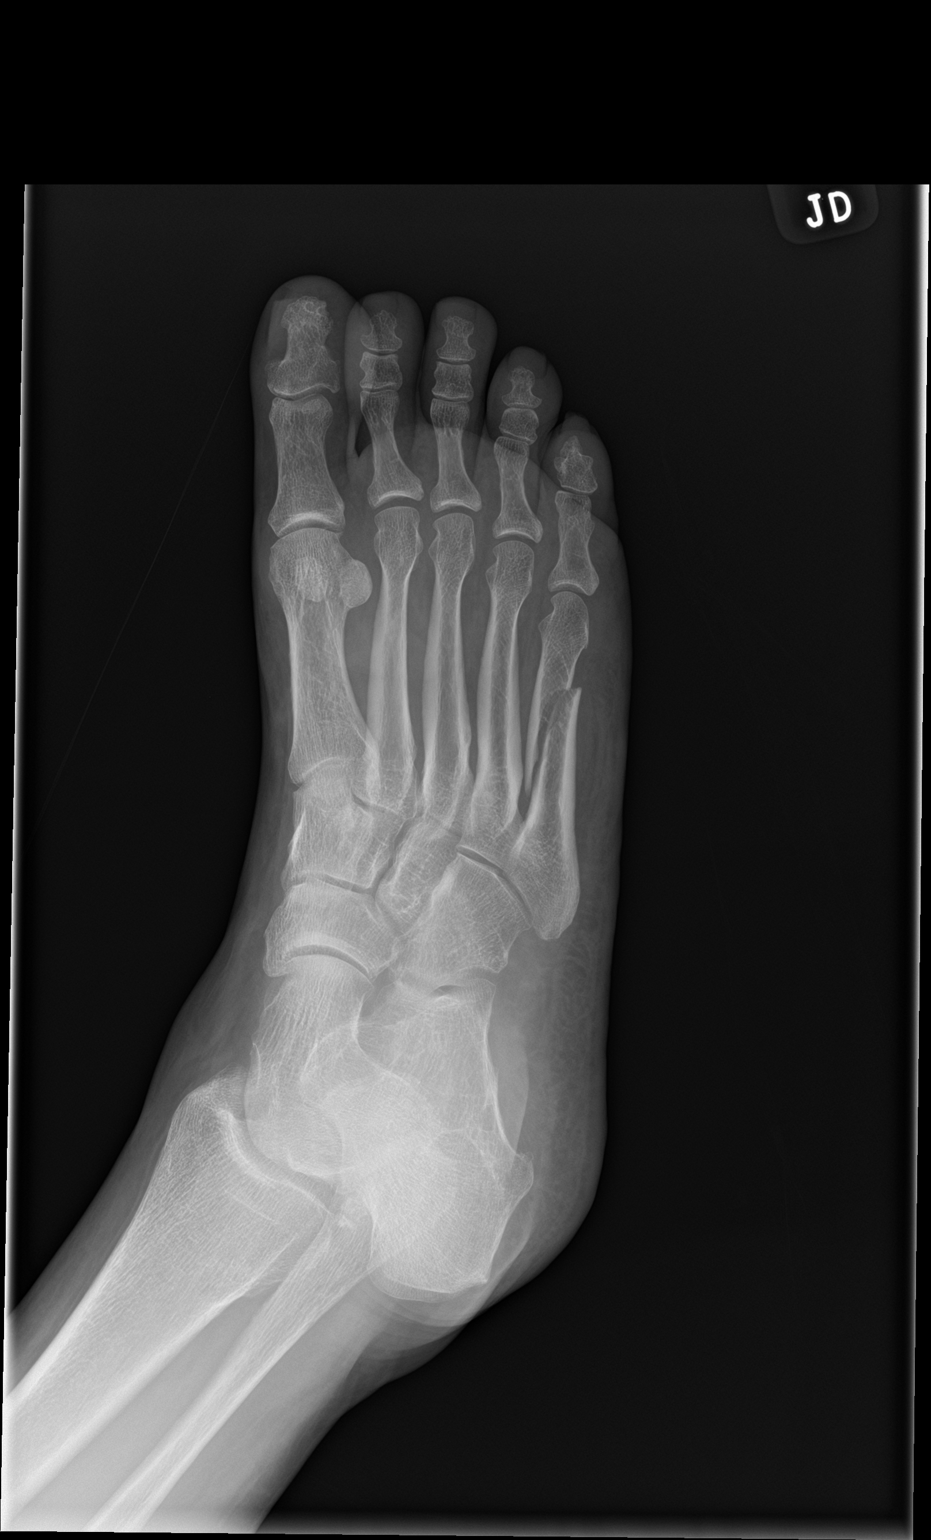

[foot lat]
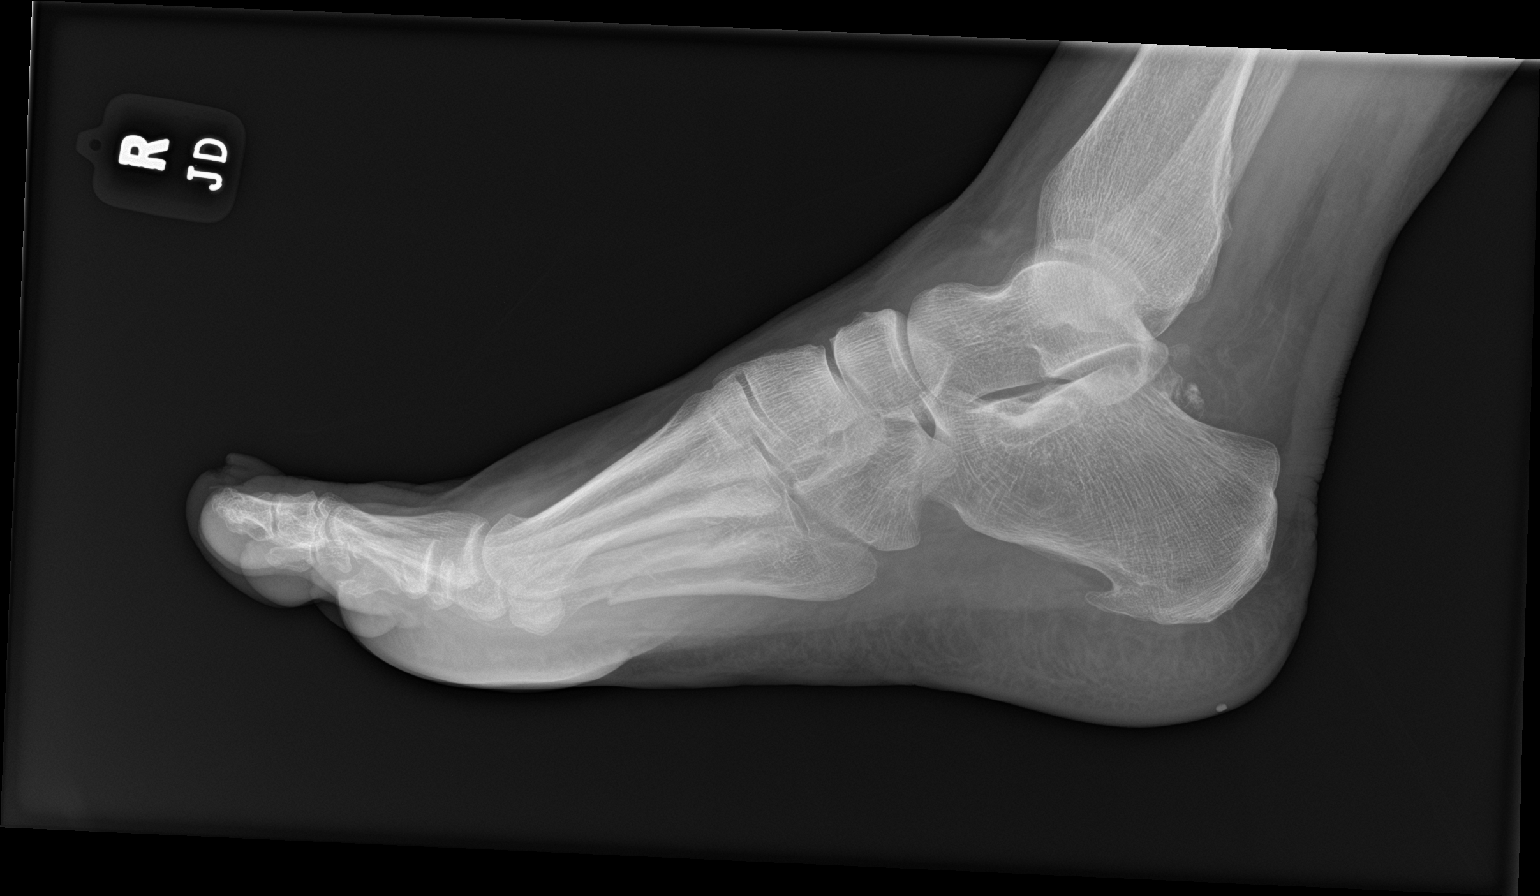

[3 of 3 positions shown; findings below may reference images not displayed]

FINDINGS: Lung oblique fracture noted in the diaphysis of the fifth
metatarsal. Approximately [DATE] shaft with medial displacement of the
distal fragment relative to the proximal. No extension to an
articular surface. No other acute bony abnormality evident.
IMPRESSION: Oblique fracture of the fifth metatarsal diaphysis.

## 2023-07-28 ENCOUNTER — Ambulatory Visit (INDEPENDENT_AMBULATORY_CARE_PROVIDER_SITE_OTHER): Payer: No Typology Code available for payment source
